# Patient Record
Sex: Male | Born: 1970 | Race: White | Hispanic: No | Marital: Married | Smoking: Current every day smoker
Health system: Southern US, Community
[De-identification: ages and names within clinical notes are randomized; demographics above are authoritative.]

## PROBLEM LIST (undated history)

## (undated) DIAGNOSIS — F32A Depression, unspecified: Secondary | ICD-10-CM

## (undated) DIAGNOSIS — E7800 Pure hypercholesterolemia, unspecified: Secondary | ICD-10-CM

## (undated) DIAGNOSIS — R7989 Other specified abnormal findings of blood chemistry: Secondary | ICD-10-CM

## (undated) DIAGNOSIS — J302 Other seasonal allergic rhinitis: Secondary | ICD-10-CM

## (undated) DIAGNOSIS — J45909 Unspecified asthma, uncomplicated: Secondary | ICD-10-CM

## (undated) DIAGNOSIS — I10 Essential (primary) hypertension: Secondary | ICD-10-CM

## (undated) DIAGNOSIS — G8929 Other chronic pain: Secondary | ICD-10-CM

## (undated) DIAGNOSIS — F988 Other specified behavioral and emotional disorders with onset usually occurring in childhood and adolescence: Secondary | ICD-10-CM

## (undated) DIAGNOSIS — F1010 Alcohol abuse, uncomplicated: Secondary | ICD-10-CM

## (undated) DIAGNOSIS — F419 Anxiety disorder, unspecified: Secondary | ICD-10-CM

## (undated) HISTORY — DX: Other specified abnormal findings of blood chemistry: R79.89

## (undated) HISTORY — PX: SHOULDER ARTHROSCOPY: SHX128

## (undated) HISTORY — DX: Other chronic pain: G89.29

## (undated) HISTORY — PX: LEG SURGERY: SHX1003

## (undated) HISTORY — DX: Other seasonal allergic rhinitis: J30.2

## (undated) HISTORY — DX: Depression, unspecified: F32.A

## (undated) HISTORY — PX: ADENOIDECTOMY: SUR15

## (undated) HISTORY — DX: Other specified behavioral and emotional disorders with onset usually occurring in childhood and adolescence: F98.8

## (undated) HISTORY — DX: Anxiety disorder, unspecified: F41.9

## (undated) HISTORY — PX: KNEE SURGERY: SHX244

---

## 2006-02-24 ENCOUNTER — Ambulatory Visit (HOSPITAL_BASED_OUTPATIENT_CLINIC_OR_DEPARTMENT_OTHER): Admission: RE | Admit: 2006-02-24 | Discharge: 2006-02-24 | Payer: Self-pay | Admitting: Orthopedic Surgery

## 2006-03-21 ENCOUNTER — Encounter: Admission: RE | Admit: 2006-03-21 | Discharge: 2006-03-21 | Payer: Self-pay | Admitting: Orthopedic Surgery

## 2009-08-21 ENCOUNTER — Ambulatory Visit: Payer: Self-pay | Admitting: Diagnostic Radiology

## 2009-08-21 ENCOUNTER — Emergency Department (HOSPITAL_BASED_OUTPATIENT_CLINIC_OR_DEPARTMENT_OTHER): Admission: EM | Admit: 2009-08-21 | Discharge: 2009-08-21 | Payer: Self-pay | Admitting: Emergency Medicine

## 2011-04-01 NOTE — Op Note (Signed)
NAME:  Marvin Cooper, Marvin Cooper                 ACCOUNT NO.:  [id number]   MEDICAL RECORD NO.:  [id number]          PATIENT TYPE:  AMB   LOCATION:  DSC                          FACILITY:  [facility]   PHYSICIAN:  Harvie Junior, M.D.   DATE OF BIRTH:  [birth year]   DATE OF PROCEDURE:  02/24/2006  DATE OF DISCHARGE:                                 OPERATIVE REPORT   PREOPERATIVE DIAGNOSIS:  Medial side knee pain with suspected scarring,  status post IM rod of the tibia.   POSTOPERATIVE DIAGNOSIS:  Medial side knee pain with suspected scarring,  status post IM rod of the tibia.   PRINCIPAL PROCEDURE:  1.  Chondroplasty medial femoral condyle.  2.  Medial and lateral synovectomy with debridement of synovial tissue with      scar from underneath the patellar tendon on the anterior aspect of the      tibia.   SURGEON:  Graves.   ASSISTANT:  B[place]une.   ANESTHESIA:  General.   BRIEF HISTORY:  [name] is a 40 year old male with a long history of  having had previous tibial rodding.  He continued to have medial side knee  pain.  He was treated with injection therapy and seemed to help.  Ultimately  felt it was probably a scar-related issue or it could be a chondral injury.  He was taken to the operating room for operative knee arthroscopy.   PROCEDURE:  The patient was taken to the operating room and after adequate  anesthesia was obtained with general anesthetic, the patient was placed upon  the operating room table.  The right knee was then prepped and draped in the  usual sterile fashion.  Following this, the arthroscopic portals were made  and a tremendous amount of scar tissue was encountered on the medial side.  This was debrided.  Once this debridement was undertaken, there was a small  chondral injury on the medial side.  The medial meniscus was normal.  The  lateral meniscus was normal.  The mild chondral injury on the medial side  was debrided by way of chondroplasty.  Attention was then  turned towards the  medial and lateral gutters which really just had a significant amount of  scar tissue.  This was debrided.  Then we went down on the anterior aspect  of the tibia, debriding that all the way down on the anterior tibia down to  bone.  This was debrided, and this freed up the patellar tendon to some  degree.  At this point, the knee was copiously irrigated and suctioned dry.  The arthroscopic portals were closed with a bandage.  A sterile compression  dressing was applied.  The patient was taken to the recovery room where he  was noted to be in satisfactory condition.  Estimated blood loss for the  procedure was none.     Harvie Junior, M.D.  Electronically Signed    JLG/MEDQ  D:  02/24/2006  T:  02/24/2006  Job:  [id number]

## 2012-06-07 ENCOUNTER — Ambulatory Visit: Payer: BC Managed Care – PPO | Admitting: Rehabilitation

## 2013-07-12 ENCOUNTER — Emergency Department (HOSPITAL_COMMUNITY)
Admission: EM | Admit: 2013-07-12 | Discharge: 2013-07-13 | Disposition: A | Discharge status: Other Acute Inpatient Hospital | Payer: BC Managed Care – PPO | Attending: Emergency Medicine | Admitting: Emergency Medicine

## 2013-07-12 ENCOUNTER — Encounter (HOSPITAL_COMMUNITY): Payer: Self-pay | Admitting: Emergency Medicine

## 2013-07-12 DIAGNOSIS — F1210 Cannabis abuse, uncomplicated: Secondary | ICD-10-CM | POA: Insufficient documentation

## 2013-07-12 DIAGNOSIS — E7800 Pure hypercholesterolemia, unspecified: Secondary | ICD-10-CM | POA: Insufficient documentation

## 2013-07-12 DIAGNOSIS — F1010 Alcohol abuse, uncomplicated: Secondary | ICD-10-CM | POA: Insufficient documentation

## 2013-07-12 DIAGNOSIS — I10 Essential (primary) hypertension: Secondary | ICD-10-CM | POA: Insufficient documentation

## 2013-07-12 DIAGNOSIS — F17200 Nicotine dependence, unspecified, uncomplicated: Secondary | ICD-10-CM | POA: Insufficient documentation

## 2013-07-12 HISTORY — DX: Pure hypercholesterolemia, unspecified: E78.00

## 2013-07-12 HISTORY — DX: Essential (primary) hypertension: I10

## 2013-07-12 LAB — COMPREHENSIVE METABOLIC PANEL
AST: 33 U/L (ref 0–37)
Albumin: 4.2 g/dL (ref 3.5–5.2)
BUN: 11 mg/dL (ref 6–23)
CO2: 29 mEq/L (ref 19–32)
Calcium: 9.2 mg/dL (ref 8.4–10.5)
Chloride: 104 mEq/L (ref 96–112)
Creatinine, Ser: 0.68 mg/dL (ref 0.50–1.35)
GFR calc Af Amer: >90 mL/min (ref >90)
GFR calc non Af Amer: >90 mL/min (ref >90)
Sodium: 143 mEq/L (ref 135–145)
Total Bilirubin: 0.2 mg/dL — ABNORMAL LOW (ref 0.3–1.2)

## 2013-07-12 LAB — RAPID URINE DRUG SCREEN, HOSP PERFORMED
Barbiturates: NOT DETECTED
Cocaine: NOT DETECTED

## 2013-07-12 LAB — SALICYLATE LEVEL: Salicylate Lvl: <2 mg/dL — ABNORMAL LOW (ref 2.8–20.0)

## 2013-07-12 LAB — CBC
MCH: 31.1 pg (ref 26.0–34.0)
MCV: 90.9 fL (ref 78.0–100.0)
Platelets: 221 10*3/uL (ref 150–400)
RBC: 5.27 MIL/uL (ref 4.22–5.81)
RDW: 15 % (ref 11.5–15.5)
WBC: 10.6 10*3/uL — ABNORMAL HIGH (ref 4.0–10.5)

## 2013-07-12 LAB — ETHANOL: Alcohol, Ethyl (B): 333 mg/dL — ABNORMAL HIGH (ref 0–11)

## 2013-07-12 MED ORDER — ONDANSETRON HCL 4 MG PO TABS: 4.0000 mg | ORAL_TABLET | Freq: Three times a day (TID) | ORAL | Status: DC | PRN

## 2013-07-12 MED ORDER — THIAMINE HCL 100 MG/ML IJ SOLN: 100.0000 mg | Freq: Every day | INTRAMUSCULAR | Status: DC

## 2013-07-12 MED ORDER — LORAZEPAM 2 MG/ML IJ SOLN: 1.0000 mg | Freq: Four times a day (QID) | INTRAMUSCULAR | Status: DC | PRN

## 2013-07-12 MED ORDER — IBUPROFEN 200 MG PO TABS: 600.0000 mg | ORAL_TABLET | Freq: Three times a day (TID) | ORAL | Status: DC | PRN

## 2013-07-12 MED ORDER — VITAMIN B-1 100 MG PO TABS
100.0000 mg | ORAL_TABLET | Freq: Every day | ORAL | Status: DC
  Administered 2013-07-12 – 2013-07-13 (×2): 100 mg via ORAL
  Filled 2013-07-12 (×2): qty 1

## 2013-07-12 MED ORDER — ALUM & MAG HYDROXIDE-SIMETH 200-200-20 MG/5ML PO SUSP: 30.0000 mL | ORAL | Status: DC | PRN

## 2013-07-12 MED ORDER — LORAZEPAM 1 MG PO TABS
0.0000 mg | ORAL_TABLET | Freq: Four times a day (QID) | ORAL | Status: DC
  Administered 2013-07-12 – 2013-07-13 (×3): 1 mg via ORAL
  Filled 2013-07-12 (×3): qty 1

## 2013-07-12 MED ORDER — LORAZEPAM 1 MG PO TABS
1.0000 mg | ORAL_TABLET | Freq: Four times a day (QID) | ORAL | Status: DC | PRN
  Administered 2013-07-12: 1 mg via ORAL
  Filled 2013-07-12: qty 1

## 2013-07-12 MED ORDER — LORAZEPAM 1 MG PO TABS
1.0000 mg | ORAL_TABLET | Freq: Three times a day (TID) | ORAL | Status: DC | PRN
Start: 2013-07-12 — End: 2013-07-13

## 2013-07-12 MED ORDER — LORAZEPAM 1 MG PO TABS: 0.0000 mg | ORAL_TABLET | Freq: Two times a day (BID) | ORAL | Status: DC

## 2013-07-12 MED ORDER — FOLIC ACID 1 MG PO TABS
1.0000 mg | ORAL_TABLET | Freq: Every day | ORAL | Status: DC
  Administered 2013-07-12 – 2013-07-13 (×2): 1 mg via ORAL
  Filled 2013-07-12 (×2): qty 1

## 2013-07-12 MED ORDER — NICOTINE 21 MG/24HR TD PT24
21.0000 mg | MEDICATED_PATCH | Freq: Every day | TRANSDERMAL | Status: DC
  Administered 2013-07-12 – 2013-07-13 (×2): 21 mg via TRANSDERMAL
  Filled 2013-07-12 (×2): qty 1

## 2013-07-12 MED ORDER — ZOLPIDEM TARTRATE 5 MG PO TABS
5.0000 mg | ORAL_TABLET | Freq: Every evening | ORAL | Status: DC | PRN
  Administered 2013-07-12: 5 mg via ORAL
  Filled 2013-07-12: qty 1

## 2013-07-12 MED ORDER — ADULT MULTIVITAMIN W/MINERALS CH
1.0000 | ORAL_TABLET | Freq: Every day | ORAL | Status: DC
  Administered 2013-07-12 – 2013-07-13 (×2): 1 via ORAL
  Filled 2013-07-12 (×2): qty 1

## 2013-07-12 MED ORDER — ALBUTEROL SULFATE HFA 108 (90 BASE) MCG/ACT IN AERS
2.0000 | INHALATION_SPRAY | RESPIRATORY_TRACT | Status: DC | PRN
  Administered 2013-07-12 – 2013-07-13 (×2): 2 via RESPIRATORY_TRACT
  Filled 2013-07-12 (×2): qty 6.7

## 2013-07-12 NOTE — ED Notes (Addendum)
One bag of pt belongings transferred to Psych ED and given to Psych ED staff.

## 2013-07-12 NOTE — ED Notes (Signed)
Pt was up at nursing station calling to check on his son and stated he really needed his Albuterol Inhaler because he's wheezing. He does use a nebulizer at home but doesn't feel he needs that at this time. Writer called EDP Pollina and requested Albuterol inhaler and CIWA protocol to be ordered; he said he'll order both. Pt currently talking to his wife.

## 2013-07-12 NOTE — ED Notes (Signed)
Pt changing into scrubs and attempting to urinate at this time

## 2013-07-12 NOTE — ED Provider Notes (Signed)
Medical screening examination/treatment/procedure(s) were performed by non-physician practitioner and as supervising physician I was immediately available for consultation/collaboration.   William Rhyse Skowron, MD 07/12/13 2057 
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
## 2013-07-12 NOTE — ED Notes (Signed)
Pt signed consent written so that his son, mom and wife may get updates on him, it's in his chart.

## 2013-07-12 NOTE — BH Assessment (Signed)
[facility] Assessment Progress Note Update:  Pt accepted by Maryjean Morn, PA, @ [phone] pending a bed at [facility].  There are no beds currently.  Therefore, TTS will need to attempt placement at [facility] and [facility].

## 2013-07-12 NOTE — ED Provider Notes (Signed)
CSN: [id number]     Arrival date & time 07/12/13  1504 History  This chart was scribed for non-physician practitioner, Johnnette Gourd, PA-C, working with Dagmar Hait, MD by Ronal Fear, ED scribe. This patient was seen in room [facility]TR4/[facility]PT4 and the patient's care was started at 3:11 PM.      Chief Complaint  Patient presents with  . Medical Clearance    The history is provided by the patient. No language interpreter was used.    HPI Comments: Marvin Cooper is a 42 y.o. male who presents to the Emergency Department presenting with alcohol intoxication asking for detox. Pt states that he has had the drinking problem for years. He states he started drinking because his son is in the hospital. He states that he "drinks as much liquor as he can".  His last drink was this morning. Pt took 2 clonopin and smoked marijuana today. Pt states that he feels sad that he did this to his family. He takes HTN and high cholesterol medications daily. Pt denies SI/HI. Pt denies any other recreational drug use. Denies any other complaints at this time.   No past medical history on file. No past surgical history on file. No family history on file. History  Substance Use Topics  . Smoking status: Not on file  . Smokeless tobacco: Not on file  . Alcohol Use: Not on file    Review of Systems  Psychiatric/Behavioral: Negative for suicidal ideas.  All other systems reviewed and are negative.    Allergies  Review of patient's allergies indicates not on file.  Home Medications  No current outpatient prescriptions on file.  Triage Vitals: BP 146/97  Pulse 89  Temp(Src) 98.7 F (37.1 C) (Oral)  Resp 16  SpO2 100%  Physical Exam  Nursing note and vitals reviewed. Constitutional: He is oriented to person, place, and time. He appears well-developed and well-nourished. No distress.  Smells of alcohol  HENT:  Head: Normocephalic and atraumatic.  Eyes: Conjunctivae and EOM are normal. Pupils are  equal, round, and reactive to light.  Neck: Normal range of motion. Neck supple.  Cardiovascular: Normal rate, regular rhythm and normal heart sounds.   Pulmonary/Chest: Effort normal and breath sounds normal. No respiratory distress. He has no wheezes. He has no rales.  Abdominal: Soft. There is no tenderness.  Musculoskeletal: Normal range of motion. He exhibits no edema.  Neurological: He is alert and oriented to person, place, and time.  Skin: Skin is warm and dry.  Psychiatric: He has a normal mood and affect. His behavior is normal.    ED Course  Procedures (including critical care time)  DIAGNOSTIC STUDIES: Oxygen Saturation is 100% on RA, normal by my interpretation.    COORDINATION OF CARE: 3:35 PM- Pt advised of plan for treatment including detiox and pt agrees.    Labs Review Labs Reviewed  ACETAMINOPHEN LEVEL  CBC  COMPREHENSIVE METABOLIC PANEL  ETHANOL  SALICYLATE LEVEL  URINE RAPID DRUG SCREEN (HOSP PERFORMED)   Imaging Review No results found.  MDM   1. Alcohol abuse    Patient presenting with alcohol abuse, requesting detox and rehabilitation. No suicidal or homicidal ideations. Psych labs pending. Awaiting act team consult. He is in no apparent distress. Physical exam unremarkable other than odor of alcohol.   Spoke with Ava with ACT team who will evaluate patient.  Patient medically cleared.   I personally performed the services described in this documentation, which was scribed in my presence. The  recorded information has been reviewed and is accurate.    Trevor Mace, PA-C 07/12/13 1955

## 2013-07-12 NOTE — BH Assessment (Signed)
Staff from [facility] called back. They have exhausted sponsorship for [facility] residents and will not be able to accept the Pt at this time. TTS staff will contact [facility] for placement. Pt has been accepted to [facility] and is pending a bed.  Harlin Rain Ria Comment, [facility] Triage Specialist
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
## 2013-07-12 NOTE — BH Assessment (Signed)
Contacted Christy at [facility] who said a bed is available. Faxed information to [facility] at [phone].  Harlin Rain Ria Comment, [facility] Triage Specialist

## 2013-07-12 NOTE — ED Notes (Addendum)
Per EMS: Wife came home and found pt unresponsive in floor. Wife called EMS, upon EMS arrival pt was A/O and ambulatory. Pt reports drinking a 12 pack of beer, a pint of liquor today, taking several klonopin, and smoking marijuana today. Pt has a history of ETOH abuse. Pt denies HI/SI.

## 2013-07-12 NOTE — ED Notes (Signed)
Marvin Cooper, wife- wants to be called if pt is moved to different facility [phone]

## 2013-07-13 ENCOUNTER — Encounter (HOSPITAL_COMMUNITY): Payer: Self-pay | Admitting: *Deleted

## 2013-07-13 ENCOUNTER — Inpatient Hospital Stay (HOSPITAL_COMMUNITY)
Admission: AD | Admit: 2013-07-13 | Discharge: 2013-07-15 | DRG: 897 | Disposition: A | Discharge status: Home / Self Care | Payer: No Typology Code available for payment source | Source: Intra-hospital | Attending: Psychiatry | Admitting: Psychiatry

## 2013-07-13 DIAGNOSIS — E7800 Pure hypercholesterolemia, unspecified: Secondary | ICD-10-CM | POA: Diagnosis present

## 2013-07-13 DIAGNOSIS — F1910 Other psychoactive substance abuse, uncomplicated: Secondary | ICD-10-CM

## 2013-07-13 DIAGNOSIS — F1010 Alcohol abuse, uncomplicated: Secondary | ICD-10-CM | POA: Diagnosis present

## 2013-07-13 DIAGNOSIS — F1020 Alcohol dependence, uncomplicated: Secondary | ICD-10-CM | POA: Diagnosis present

## 2013-07-13 DIAGNOSIS — F1410 Cocaine abuse, uncomplicated: Secondary | ICD-10-CM | POA: Diagnosis present

## 2013-07-13 DIAGNOSIS — F10939 Alcohol use, unspecified with withdrawal, unspecified: Principal | ICD-10-CM | POA: Diagnosis present

## 2013-07-13 DIAGNOSIS — F10239 Alcohol dependence with withdrawal, unspecified: Principal | ICD-10-CM | POA: Diagnosis present

## 2013-07-13 DIAGNOSIS — F17200 Nicotine dependence, unspecified, uncomplicated: Secondary | ICD-10-CM | POA: Diagnosis present

## 2013-07-13 DIAGNOSIS — I10 Essential (primary) hypertension: Secondary | ICD-10-CM | POA: Diagnosis present

## 2013-07-13 DIAGNOSIS — F329 Major depressive disorder, single episode, unspecified: Secondary | ICD-10-CM | POA: Diagnosis present

## 2013-07-13 DIAGNOSIS — F331 Major depressive disorder, recurrent, moderate: Secondary | ICD-10-CM | POA: Diagnosis present

## 2013-07-13 DIAGNOSIS — F411 Generalized anxiety disorder: Secondary | ICD-10-CM | POA: Diagnosis present

## 2013-07-13 MED ORDER — NIACIN ER 500 MG PO CPCR
500.0000 mg | ORAL_CAPSULE | Freq: Every day | ORAL | Status: DC
  Administered 2013-07-13: 500 mg via ORAL
  Filled 2013-07-13 (×2): qty 1

## 2013-07-13 MED ORDER — CITALOPRAM HYDROBROMIDE 20 MG PO TABS
20.0000 mg | ORAL_TABLET | Freq: Every day | ORAL | Status: DC
  Administered 2013-07-14 – 2013-07-15 (×2): 20 mg via ORAL
  Filled 2013-07-13: qty 1
  Filled 2013-07-13: qty 14
  Filled 2013-07-13 (×2): qty 1

## 2013-07-13 MED ORDER — ONDANSETRON HCL 4 MG PO TABS: 4.0000 mg | ORAL_TABLET | Freq: Three times a day (TID) | ORAL | Status: DC | PRN

## 2013-07-13 MED ORDER — CITALOPRAM HYDROBROMIDE 40 MG PO TABS: 40.0000 mg | ORAL_TABLET | Freq: Every day | ORAL | Status: DC

## 2013-07-13 MED ORDER — OMEGA-3-ACID ETHYL ESTERS 1 G PO CAPS
1.0000 g | ORAL_CAPSULE | Freq: Two times a day (BID) | ORAL | Status: DC
  Administered 2013-07-13 – 2013-07-15 (×4): 1 g via ORAL
  Filled 2013-07-13 (×4): qty 1
  Filled 2013-07-13: qty 28
  Filled 2013-07-13 (×2): qty 1
  Filled 2013-07-13: qty 28
  Filled 2013-07-13: qty 1

## 2013-07-13 MED ORDER — ACETAMINOPHEN 325 MG PO TABS: 650.0000 mg | ORAL_TABLET | Freq: Four times a day (QID) | ORAL | Status: DC | PRN

## 2013-07-13 MED ORDER — FENOFIBRATE 160 MG PO TABS
160.0000 mg | ORAL_TABLET | Freq: Every day | ORAL | Status: DC
  Administered 2013-07-13 – 2013-07-15 (×3): 160 mg via ORAL
  Filled 2013-07-13 (×2): qty 1
  Filled 2013-07-13: qty 14
  Filled 2013-07-13 (×2): qty 1

## 2013-07-13 MED ORDER — ADULT MULTIVITAMIN W/MINERALS CH
1.0000 | ORAL_TABLET | Freq: Every day | ORAL | Status: DC
  Administered 2013-07-13 – 2013-07-15 (×3): 1 via ORAL
  Filled 2013-07-13 (×6): qty 1

## 2013-07-13 MED ORDER — TRAZODONE HCL 100 MG PO TABS
50.0000 mg | ORAL_TABLET | Freq: Every evening | ORAL | Status: DC | PRN
  Administered 2013-07-13 – 2013-07-14 (×2): 50 mg via ORAL
  Filled 2013-07-13 (×2): qty 1
  Filled 2013-07-13 (×2): qty 14

## 2013-07-13 MED ORDER — NICOTINE 21 MG/24HR TD PT24
21.0000 mg | MEDICATED_PATCH | Freq: Every day | TRANSDERMAL | Status: DC
  Administered 2013-07-13 – 2013-07-15 (×3): 21 mg via TRANSDERMAL
  Filled 2013-07-13 (×7): qty 1

## 2013-07-13 MED ORDER — NIACIN ER 500 MG PO CPCR
500.0000 mg | ORAL_CAPSULE | Freq: Every day | ORAL | Status: DC
  Administered 2013-07-14 (×2): 500 mg via ORAL
  Filled 2013-07-13 (×4): qty 1
  Filled 2013-07-13: qty 14

## 2013-07-13 MED ORDER — LORAZEPAM 1 MG PO TABS: 0.0000 mg | ORAL_TABLET | Freq: Two times a day (BID) | ORAL | Status: DC

## 2013-07-13 MED ORDER — BENAZEPRIL HCL 10 MG PO TABS
40.0000 mg | ORAL_TABLET | Freq: Every day | ORAL | Status: DC
  Administered 2013-07-14 – 2013-07-15 (×2): 40 mg via ORAL
  Filled 2013-07-13 (×2): qty 1
  Filled 2013-07-13: qty 56
  Filled 2013-07-13: qty 1

## 2013-07-13 MED ORDER — LORATADINE 10 MG PO TABS
10.0000 mg | ORAL_TABLET | Freq: Every day | ORAL | Status: DC
  Administered 2013-07-13: 10 mg via ORAL
  Filled 2013-07-13 (×2): qty 1

## 2013-07-13 MED ORDER — OMEGA-3-ACID ETHYL ESTERS 1 G PO CAPS
2.0000 g | ORAL_CAPSULE | Freq: Two times a day (BID) | ORAL | Status: DC
  Administered 2013-07-13 (×2): 2 g via ORAL
  Filled 2013-07-13 (×4): qty 2

## 2013-07-13 MED ORDER — CITALOPRAM HYDROBROMIDE 20 MG PO TABS
20.0000 mg | ORAL_TABLET | Freq: Every day | ORAL | Status: DC
  Administered 2013-07-13: 20 mg via ORAL
  Filled 2013-07-13 (×2): qty 1

## 2013-07-13 MED ORDER — VITAMIN B-1 100 MG PO TABS
100.0000 mg | ORAL_TABLET | Freq: Every day | ORAL | Status: DC
  Administered 2013-07-13 – 2013-07-15 (×3): 100 mg via ORAL
  Filled 2013-07-13 (×5): qty 1

## 2013-07-13 MED ORDER — LORAZEPAM 1 MG PO TABS
0.0000 mg | ORAL_TABLET | Freq: Four times a day (QID) | ORAL | Status: DC
  Administered 2013-07-13 – 2013-07-15 (×6): 1 mg via ORAL
  Filled 2013-07-13 (×6): qty 1

## 2013-07-13 MED ORDER — ALBUTEROL SULFATE HFA 108 (90 BASE) MCG/ACT IN AERS
2.0000 | INHALATION_SPRAY | RESPIRATORY_TRACT | Status: DC | PRN
  Administered 2013-07-15 (×2): 2 via RESPIRATORY_TRACT

## 2013-07-13 MED ORDER — SIMVASTATIN 40 MG PO TABS
40.0000 mg | ORAL_TABLET | Freq: Every day | ORAL | Status: DC
  Administered 2013-07-13: 40 mg via ORAL
  Filled 2013-07-13 (×2): qty 1

## 2013-07-13 MED ORDER — SIMVASTATIN 40 MG PO TABS
40.0000 mg | ORAL_TABLET | Freq: Every day | ORAL | Status: DC
  Administered 2013-07-14 (×2): 40 mg via ORAL
  Filled 2013-07-13: qty 1
  Filled 2013-07-13: qty 14
  Filled 2013-07-13 (×3): qty 1

## 2013-07-13 MED ORDER — BENAZEPRIL HCL 40 MG PO TABS
40.0000 mg | ORAL_TABLET | Freq: Every day | ORAL | Status: DC
  Administered 2013-07-13: 40 mg via ORAL
  Filled 2013-07-13 (×2): qty 1

## 2013-07-13 MED ORDER — THIAMINE HCL 100 MG/ML IJ SOLN: 100.0000 mg | Freq: Every day | INTRAMUSCULAR | Status: DC

## 2013-07-13 MED ORDER — IBUPROFEN 600 MG PO TABS: 600.0000 mg | ORAL_TABLET | Freq: Three times a day (TID) | ORAL | Status: DC | PRN

## 2013-07-13 MED ORDER — ALUM & MAG HYDROXIDE-SIMETH 200-200-20 MG/5ML PO SUSP: 30.0000 mL | ORAL | Status: DC | PRN

## 2013-07-13 MED ORDER — FOLIC ACID 1 MG PO TABS
1.0000 mg | ORAL_TABLET | Freq: Every day | ORAL | Status: DC
Start: 2013-07-13 — End: 2013-07-15
  Administered 2013-07-13 – 2013-07-15 (×3): 1 mg via ORAL
  Filled 2013-07-13 (×5): qty 1

## 2013-07-13 MED ORDER — LORATADINE 10 MG PO TABS
10.0000 mg | ORAL_TABLET | Freq: Every day | ORAL | Status: DC
  Administered 2013-07-14 – 2013-07-15 (×2): 10 mg via ORAL
  Filled 2013-07-13 (×4): qty 1

## 2013-07-13 MED ORDER — MAGNESIUM HYDROXIDE 400 MG/5ML PO SUSP: 30.0000 mL | Freq: Every day | ORAL | Status: DC | PRN

## 2013-07-13 MED ORDER — FENOFIBRATE 54 MG PO TABS
54.0000 mg | ORAL_TABLET | Freq: Every day | ORAL | Status: DC
  Administered 2013-07-13: 54 mg via ORAL
  Filled 2013-07-13 (×2): qty 1

## 2013-07-13 NOTE — [facility] Counselor (Signed)
Pt accepted to [facility] to room 300-2.  Pt ready to be received to [facility] at 1530.  If ED can't make that time frame then pt can be received by 1715.    Call report prior to sending pt - [phone]  Security can transport  Voluntary form sent to ED for nurse to complete with pt.  Send original with pt to [facility] and fax copy to [facility] prior to sending pt.  [phone] fax

## 2013-07-13 NOTE — Consult Note (Signed)
  Patient was accepted by another provider for inpatient admission.  He was seen today by this Clinical research associate and Dr Orpha Bur  Who agrees with the plan of care for an inpatient admission.  Patient will be admitted once bed is available, meanwhile, we will continue detox with our Ativan Detox protocol.  This an patient denies drinking excessively to hurt himself.  He says he bing drank and used Klonopin because he wanted to "numb" his pain.  Patient states his son was involved in an accident and will be be losing one of his legs.  Patient also reports a diagnosis of depression and that he has been on Celexa 40 mg daily.  Patient reports started seeing a counselor in the past three weeks before he came in to the ER. Plan: We will admit him to inpatient unit as soon as bed is available We will resume his Celexa if not already done. Marvin Cooper   PMHNP-BC.
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
## 2013-07-13 NOTE — ED Notes (Signed)
Belongings given to his wife to take home. Transferred to [facility] via hospital security and mental health tech. Ambulatory without difficulty. No complaints voiced.

## 2013-07-13 NOTE — BH Assessment (Signed)
Contacted [employer] to obtain sponsorship authorization for [facility]. Cheryl at [place] [facility] said that she was unable to enroll Pt and that [facility] staff would need to enroll Pt through [facility]. There is no weekend staff that has access to the [facility] system and therefore Pt cannot be transferred to [facility] this weekend. To avoid delay in Pt care Pt will be admitted to [facility] when a bed is available.  Harlin Rain Ria Comment, [facility] Triage Specialist

## 2013-07-14 ENCOUNTER — Encounter (HOSPITAL_COMMUNITY): Payer: Self-pay | Admitting: Physician Assistant

## 2013-07-14 DIAGNOSIS — F331 Major depressive disorder, recurrent, moderate: Secondary | ICD-10-CM

## 2013-07-14 DIAGNOSIS — F411 Generalized anxiety disorder: Secondary | ICD-10-CM

## 2013-07-14 DIAGNOSIS — F1010 Alcohol abuse, uncomplicated: Secondary | ICD-10-CM
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
## 2013-07-14 NOTE — [facility] Group Notes (Signed)
[facility] Group Notes:  (Nursing/MHT/Case Management/Adjunct)  Date:  07/14/2013  Time:  10:33 AM  Type of Therapy:  Psychoeducational Skills  Participation Level:  Did Not Attend   Buford Dresser 07/14/2013, 10:33 AM

## 2013-07-14 NOTE — Progress Notes (Signed)
Patient ID: Marvin Cooper, male   DOB: 04/13/1971, 42 y.o.   MRN: [id number] D)  Has been pleasant but quiet, doesn't seem to initiate conversation but is conversational when spoken to, appropriate.  States feeling anxious has been what he has been feeling most, and a little irritable, but the ativan is helping.  Attended group this evening, had a snack, orders obtained to restart home meds.   A)  Will continue to monitor for safety, continue POC, protocol R)  Safety maintained.

## 2013-07-14 NOTE — [facility] Group Notes (Signed)
[facility] Group Notes:  (Clinical Social Work)  07/14/2013  10:00-11:00AM  Summary of Progress/Problems:   The main focus of today's process group was to   identify the patient's current support system and decide on other supports that can be put in place.  The picture on workbook was used to discuss why additional supports are needed, and a hand-out was distributed with four definitions/levels of support, then used to talk about how patients have given and received all different kinds of support.  An emphasis was placed on using counselor, doctor, therapy groups, 12-step groups, and problem-specific support groups to expand supports.  The patient was only present for group for about the last 15 minutes, appeared depressed and had poor eye contact.  He did not volunteer any comments or personal information, and CSW chose not to call on him due to his slumped posture, his depressed mood, and his new arrival.  Others were processing well, and he did appear to listen intently.  CSW mentioned that it is very uncomfortable when one first comes to group, and that these participants had really opened up, with intention of encouraging him that it will become more comfortable for him later.  Type of Therapy:  Process Group with Motivational Interviewing  Participation Level:  Minimal  Participation Quality:  Attentive  Affect:  Depressed and Flat  Cognitive:  Oriented  Insight:  Limited  Engagement in Therapy:  Limited  Modes of Intervention:   Education, Support and Processing, Activity  Ambrose Mantle, LCSW 07/14/2013, 12:55 PM
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
## 2013-07-14 NOTE — Progress Notes (Signed)
Patient ID: Marvin Cooper, male   DOB: 22-Jan-1971, 42 y.o.   MRN: [id number] D: patient presents with flat, blunted affect.  He continues to have withdrawal symptoms from alcohol withdrawal.  Patient states that he was using alcohol to "numb" his feelings as he has been going through a difficult time.  He is motivated for treatment.  He denies any SI/HI/AVH.  Patient's CIWA is a 4 and 1 mg ativan was administered.  A: continue to monitor MD orders and medication management.  Safety checks completed every 15 minutes per protocol.  R: Patient's behavior has been appropriate.

## 2013-07-14 NOTE — H&P (Signed)
  Pt was seen by me today and I agree with the key elements documented in H&P.  

## 2013-07-14 NOTE — [facility] Suicide Risk Assessment (Signed)
Suicide Risk Assessment  Admission Assessment     Nursing information obtained from:    Demographic factors:   male Current Mental Status:   no si, no hi, no avh, mood is sad Loss Factors:   son lost leg recently Historical Factors:   depresed Risk Reduction Factors:   lives with family, working  CLINICAL FACTORS:   Alcohol/Substance Abuse/Dependencies  COGNITIVE FEATURES THAT CONTRIBUTE TO RISK:  Closed-mindedness    SUICIDE RISK:   Mild:  Suicidal ideation of limited frequency, intensity, duration, and specificity.  There are no identifiable plans, no associated intent, mild dysphoria and related symptoms, good self-control (both objective and subjective assessment), few other risk factors, and identifiable protective factors, including available and accessible social support.  PLAN OF CARE:  Continue current meds  I certify that inpatient services furnished can reasonably be expected to improve the patient's condition.  Wonda Cerise 07/14/2013, 12:04 PM
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
## 2013-07-15 DIAGNOSIS — F329 Major depressive disorder, single episode, unspecified: Secondary | ICD-10-CM | POA: Diagnosis present

## 2013-07-15 DIAGNOSIS — F1020 Alcohol dependence, uncomplicated: Secondary | ICD-10-CM

## 2013-07-15 DIAGNOSIS — F1010 Alcohol abuse, uncomplicated: Secondary | ICD-10-CM | POA: Diagnosis present

## 2013-07-15 DIAGNOSIS — F10239 Alcohol dependence with withdrawal, unspecified: Principal | ICD-10-CM

## 2013-07-15 MED ORDER — TRAZODONE HCL 50 MG PO TABS: 50.0000 mg | ORAL_TABLET | Freq: Every evening | ORAL | Status: DC | PRN

## 2013-07-15 MED ORDER — ALBUTEROL SULFATE HFA 108 (90 BASE) MCG/ACT IN AERS: 2.0000 | INHALATION_SPRAY | RESPIRATORY_TRACT | Status: DC | PRN

## 2013-07-15 MED ORDER — BENAZEPRIL HCL 40 MG PO TABS: 40.0000 mg | ORAL_TABLET | Freq: Every day | ORAL | Status: DC

## 2013-07-15 MED ORDER — CETIRIZINE HCL 10 MG PO TABS: 10.0000 mg | ORAL_TABLET | Freq: Every day | ORAL | Status: DC

## 2013-07-15 MED ORDER — FENOFIBRATE 150 MG PO CAPS: 150.0000 | ORAL_CAPSULE | Freq: Every day | ORAL | Status: DC

## 2013-07-15 MED ORDER — OMEGA-3-ACID ETHYL ESTERS 1 G PO CAPS: 2.0000 g | ORAL_CAPSULE | Freq: Two times a day (BID) | ORAL | Status: DC

## 2013-07-15 MED ORDER — NIACIN ER 500 MG PO CPCR: 500.0000 mg | ORAL_CAPSULE | Freq: Every day | ORAL | Status: DC

## 2013-07-15 MED ORDER — SIMVASTATIN 40 MG PO TABS: 40.0000 mg | ORAL_TABLET | Freq: Every day | ORAL | Status: DC

## 2013-07-15 MED ORDER — CITALOPRAM HYDROBROMIDE 20 MG PO TABS: 20.0000 mg | ORAL_TABLET | Freq: Every day | ORAL | Status: DC

## 2013-07-15 NOTE — Progress Notes (Signed)
Pt resting in bed with eyes closed. Breathing even and unlabored. Q15 min checks maintained for safety. Pt remains safe on the unit.   
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
## 2013-07-15 NOTE — Progress Notes (Signed)
[facility] Adult Case Management Discharge Plan :  Will you be returning to the same living situation after discharge: Yes,  home with wife At discharge, do you have transportation home?:Yes,  wife Do you have the ability to pay for your medications:Yes,  mental health  Release of information consent forms completed and in the chart;  Patient's signature needed at discharge.  Patient to Follow up at: Follow-up Information   Follow up with [facility]. (Office closed today. Please call office Tuesday to schedule followup visit for therapy. )    Contact information:   [address] [place], [place] [address] Phone: [phone] Fax: [phone]      Follow up with [facility]. (Office closed today. Call office tomorrow morning to schedule hospital followup appt. )    Contact information:   [address] [place], [place] [address] Phone: [phone] Fax: [phone]      Patient denies SI/HI:   Yes,  during admission and stay at [facility]. self report    Safety Planning and Suicide Prevention discussed:  Yes,  SPE not required for this pt as he did not endorse SI during admission or during stay at [facility]. SPI pamphlet provided to pt and he was encouraged to share with support system.   Smart, Gregory Barrick 07/15/2013, 1:24 PM

## 2013-07-15 NOTE — Progress Notes (Signed)
Marvin Cooper was discharged home with his wife.  Discharge instructions, follow up information, prescriptions, and sample medications given to patient who verbalized understanding.

## 2013-07-15 NOTE — Progress Notes (Signed)
D:  Marvin Cooper reports that he slept well and that his energy level is normal.  He is rating depression at 1/10.  He states that he is not currently experiencing any withdrawal symptoms other than some slight anxiety which he attributes to being here.  He did not want to take Ativan this am because he felt good.  He denies SI/HI/AVH at this time.   A:  Safety checks q 15 minutes.  Emotional support provided.  Medications administered as ordered. R:  Safety maintained on unit.

## 2013-07-15 NOTE — [facility] Suicide Risk Assessment (Signed)
[facility] INPATIENT: Family/Significant Other Suicide Prevention Education  Suicide Prevention Education:  Education Completed; No one has been identified by the patient as the family member/significant other with whom the patient will be residing, and identified as the person(s) who will aid the patient in the event of a mental health crisis (suicidal ideations/suicide attempt).   Pt did not c/o SI at admission, nor have they endorsed SI during their stay here. SPE not required. SPI pamphlet provided to pt and he was encouraged to ask questions and talk about any concerns. Pt also encouraged to share information with support system.   [employer], LCSWA 07/15/2013 1:04 PM
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
## 2013-07-18 NOTE — Progress Notes (Addendum)
Patient Discharge Instructions:  After Visit Summary (AVS):   Faxed to:  06/17/13 Discharge Summary Note:   Faxed to:  06/17/13 Psychiatric Admission Assessment Note:   Faxed to:  06/17/13 Suicide Risk Assessment - Discharge Assessment:   Faxed to:  06/17/13 Faxed/Sent to the Next Level Care provider:  06/17/13 Faxed to [facility] @ [phone] Faxed to [facility] @ [phone]  Jerelene Redden, 07/18/2013, 3:57 PM

## 2014-02-11 ENCOUNTER — Encounter (HOSPITAL_COMMUNITY): Payer: Self-pay | Admitting: Emergency Medicine

## 2014-02-11 ENCOUNTER — Emergency Department (HOSPITAL_COMMUNITY)
Admission: EM | Admit: 2014-02-11 | Discharge: 2014-02-12 | Disposition: A | Discharge status: Home / Self Care | Payer: BC Managed Care – PPO | Attending: Emergency Medicine | Admitting: Emergency Medicine

## 2014-02-11 DIAGNOSIS — F17200 Nicotine dependence, unspecified, uncomplicated: Secondary | ICD-10-CM | POA: Insufficient documentation

## 2014-02-11 DIAGNOSIS — E7800 Pure hypercholesterolemia, unspecified: Secondary | ICD-10-CM | POA: Insufficient documentation

## 2014-02-11 DIAGNOSIS — Z79899 Other long term (current) drug therapy: Secondary | ICD-10-CM | POA: Insufficient documentation

## 2014-02-11 DIAGNOSIS — F10929 Alcohol use, unspecified with intoxication, unspecified: Secondary | ICD-10-CM

## 2014-02-11 DIAGNOSIS — F1010 Alcohol abuse, uncomplicated: Secondary | ICD-10-CM | POA: Insufficient documentation

## 2014-02-11 DIAGNOSIS — I10 Essential (primary) hypertension: Secondary | ICD-10-CM | POA: Insufficient documentation

## 2014-02-11 NOTE — ED Notes (Signed)
Wife called because pt was so drunk. Unsteady gait. States he drank a bottle of vodka which he does every couple days. Wife took percocet because when he gets a Microbiologistpercocet rx he takes them all in 2-3 days. Denies SI/HI.

## 2014-02-12 MED ORDER — SODIUM CHLORIDE 0.9 % IV BOLUS (SEPSIS)
1000.0000 mL | Freq: Once | INTRAVENOUS | Status: AC
  Administered 2014-02-12: 1000 mL via INTRAVENOUS

## 2014-02-12 NOTE — ED Provider Notes (Signed)
CSN: [id number]     Arrival date & time 02/11/14  2152 History   First MD Initiated Contact with Patient 02/11/14 2302     Chief Complaint  Patient presents with  . Alcohol Intoxication     (Consider location/radiation/quality/duration/timing/severity/associated sxs/prior Treatment) HPI  This is a 43 year old male with a history of alcohol abuse, hypertension, and hyperlipidemia who presents with alcohol intoxication. Patient was brought after his wife called stating the patient was drunk. He was noted to have an unsteady gait.  Patient reports daily drinking. He states he drank a bottle of vodka today. He denies any physical complaints including chest pain, shortness of breath, abdominal pain, urinary symptoms, weakness, numbness, headache. He denies any other substance abuse.  Past Medical History  Diagnosis Date  . Hypertension   . Hypercholesteremia    Past Surgical History  Procedure Laterality Date  . Knee surgery    . Leg surgery Left Age 10    ORIF lower leg  . Adenoidectomy  Age 50   Family History  Problem Relation Age of Onset  . Alcohol abuse Brother   . Alcohol abuse Sister   . Alcohol abuse Maternal Grandfather    History  Substance Use Topics  . Smoking status: Current Every Day Smoker -- 1.00 packs/day for 20 years    Types: Cigarettes  . Smokeless tobacco: Never Used  . Alcohol Use: 48.0 oz/week    80 Cans of beer per week    Review of Systems  Constitutional: Negative.  Negative for fever.  Respiratory: Negative.  Negative for chest tightness and shortness of breath.   Cardiovascular: Negative.  Negative for chest pain.  Gastrointestinal: Negative.  Negative for nausea, vomiting and abdominal pain.  Genitourinary: Negative.  Negative for dysuria.  Skin: Negative for wound.  Neurological: Negative for headaches.  Psychiatric/Behavioral:       Intoxication  All other systems reviewed and are negative.      Allergies  Review of patient's  allergies indicates no known allergies.  Home Medications   Current Outpatient Rx  Name  Route  Sig  Dispense  Refill  . benazepril (LOTENSIN) 40 MG tablet   Oral   Take 1 tablet (40 mg total) by mouth daily. For high blood pressure control         . citalopram (CELEXA) 20 MG tablet   Oral   Take 1 tablet (20 mg total) by mouth daily. For depression   30 tablet   0   . Fenofibrate 150 MG CAPS   Oral   Take 150 mg by mouth daily. For cholesterol control         . hydrOXYzine (VISTARIL) 25 MG capsule   Oral   Take 25 mg by mouth 4 (four) times daily.         . niacin 500 MG CR capsule   Oral   Take 1,000 mg by mouth at bedtime. For low niacin         . simvastatin (ZOCOR) 40 MG tablet   Oral   Take 1 tablet (40 mg total) by mouth at bedtime. For high cholesterol control   30 tablet      . traZODone (DESYREL) 50 MG tablet   Oral   Take 1 tablet (50 mg total) by mouth at bedtime as needed and may repeat dose one time if needed for sleep. For sleep   60 tablet   0   . albuterol (PROVENTIL HFA;VENTOLIN HFA) 108 (90 BASE) MCG/ACT inhaler  Inhalation   Inhale 2 puffs into the lungs every 4 (four) hours as needed for wheezing or shortness of breath.         . cetirizine (ZYRTEC) 10 MG tablet   Oral   Take 1 tablet (10 mg total) by mouth daily. For allergies         . omega-3 acid ethyl esters (LOVAZA) 1 G capsule   Oral   Take 2 capsules (2 g total) by mouth 2 (two) times daily. For cholesterol control          BP 145/97  Pulse 101  Temp(Src) 98.1 F (36.7 C) (Oral)  Resp 18  SpO2 93% Physical Exam  Nursing note and vitals reviewed. Constitutional: He is oriented to person, place, and time.  Smells of alcohol, disheveled  HENT:  Head: Normocephalic and atraumatic.  MM dry  Eyes: Pupils are equal, round, and reactive to light.  4mm and reactive  Neck: Neck supple.  Cardiovascular: Normal rate, regular rhythm and normal heart sounds.   No  murmur heard. Pulmonary/Chest: Effort normal and breath sounds normal. No respiratory distress. He has no wheezes.  Abdominal: Soft. Bowel sounds are normal. There is no tenderness. There is no rebound.  Musculoskeletal: He exhibits no edema.  Lymphadenopathy:    He has no cervical adenopathy.  Neurological: He is alert and oriented to person, place, and time. He displays normal reflexes. No cranial nerve deficit.  5 out of 5 strength in all 4 extremities, gait not tested  Skin: Skin is warm and dry.  Psychiatric:  intoxicated    ED Course  Procedures (including critical care time) Labs Review Labs Reviewed - No data to display Imaging Review No results found.   EKG Interpretation None      MDM   Final diagnoses:  None    Patient presents with alcohol intoxication. He is awake, alert, and oriented. He does smell of alcohol and has slurring of speech consistent with acute intoxication. He is otherwise nonfocal. He has no physical complaints at this time. He is able to tolerate fluids by mouth. At this time and do not feel any workup is indicated. Will allow patient to metabolize. Repeat vital signs notable for tachycardia to 117. Will give normal saline bolus. Patient responded to fluids with repeat heart rate of 100. He was able to tolerate by mouth intake and was ambulated prior to discharge at his baseline.  He is awake, alert, and oriented. He continues to be without complaint. Discussed with the patient whether he would like help with his alcohol abuse. At this time he understands that he has a problem but does not wish to seek help.  After history, exam, and medical workup I feel the patient has been appropriately medically screened and is safe for discharge home. Pertinent diagnoses were discussed with the patient. Patient was given return precautions.     Shon Batonourtney F Malorie Bigford, MD 02/12/14 [phone]

## 2014-02-12 NOTE — ED Notes (Signed)
Pt ambulatory with steady gait. Alert and oriented.

## 2014-02-12 NOTE — Discharge Instructions (Signed)
Alcohol Intoxication °Alcohol intoxication occurs when the amount of alcohol that a person has consumed impairs his or her ability to mentally and physically function. Alcohol directly impairs the normal chemical activity of the brain. Drinking large amounts of alcohol can lead to changes in mental function and behavior, and it can cause many physical effects that can be harmful.  °Alcohol intoxication can range in severity from mild to very severe. Various factors can affect the level of intoxication that occurs, such as the person's age, gender, weight, frequency of alcohol consumption, and the presence of other medical conditions (such as diabetes, seizures, or heart conditions). Dangerous levels of alcohol intoxication may occur when people drink large amounts of alcohol in a short period (binge drinking). Alcohol can also be especially dangerous when combined with certain prescription medicines or "recreational" drugs. °SIGNS AND SYMPTOMS °Some common signs and symptoms of mild alcohol intoxication include: °· Loss of coordination. °· Changes in mood and behavior. °· Impaired judgment. °· Slurred speech. °As alcohol intoxication progresses to more severe levels, other signs and symptoms will appear. These may include: °· Vomiting. °· Confusion and impaired memory. °· Slowed breathing. °· Seizures. °· Loss of consciousness. °DIAGNOSIS  °Your health care provider will take a medical history and perform a physical exam. You will be asked about the amount and type of alcohol you have consumed. Blood tests will be done to measure the concentration of alcohol in your blood. In many places, your blood alcohol level must be lower than 80 mg/dL (0.08%) to legally drive. However, many dangerous effects of alcohol can occur at much lower levels.  °TREATMENT  °People with alcohol intoxication often do not require treatment. Most of the effects of alcohol intoxication are temporary, and they go away as the alcohol naturally  leaves the body. Your health care provider will monitor your condition until you are stable enough to go home. Fluids are sometimes given through an IV access tube to help prevent dehydration.  °HOME CARE INSTRUCTIONS °· Do not drive after drinking alcohol. °· Stay hydrated. Drink enough water and fluids to keep your urine clear or pale yellow. Avoid caffeine.   °· Only take over-the-counter or prescription medicines as directed by your health care provider.   °SEEK MEDICAL CARE IF:  °· You have persistent vomiting.   °· You do not feel better after a few days. °· You have frequent alcohol intoxication. Your health care provider can help determine if you should see a substance use treatment counselor. °SEEK IMMEDIATE MEDICAL CARE IF:  °· You become shaky or tremble when you try to stop drinking.   °· You shake uncontrollably (seizure).   °· You throw up (vomit) blood. This may be bright red or may look like black coffee grounds.   °· You have blood in your stool. This may be bright red or may appear as a black, tarry, bad smelling stool.   °· You become lightheaded or faint.   °MAKE SURE YOU:  °· Understand these instructions. °· Will watch your condition. °· Will get help right away if you are not doing well or get worse. °Document Released: 08/10/2005 Document Revised: 07/03/2013 Document Reviewed: 04/05/2013 °[employer]® Patient Information ©2014 [employer]. ° °

## 2014-09-26 ENCOUNTER — Encounter (HOSPITAL_BASED_OUTPATIENT_CLINIC_OR_DEPARTMENT_OTHER): Payer: Self-pay

## 2014-09-26 ENCOUNTER — Emergency Department (HOSPITAL_BASED_OUTPATIENT_CLINIC_OR_DEPARTMENT_OTHER)
Admission: EM | Admit: 2014-09-26 | Discharge: 2014-09-26 | Disposition: A | Discharge status: Home / Self Care | Payer: BC Managed Care – PPO | Attending: Emergency Medicine | Admitting: Emergency Medicine

## 2014-09-26 DIAGNOSIS — F1210 Cannabis abuse, uncomplicated: Secondary | ICD-10-CM | POA: Insufficient documentation

## 2014-09-26 DIAGNOSIS — I10 Essential (primary) hypertension: Secondary | ICD-10-CM | POA: Diagnosis not present

## 2014-09-26 DIAGNOSIS — E780 Pure hypercholesterolemia: Secondary | ICD-10-CM | POA: Insufficient documentation

## 2014-09-26 DIAGNOSIS — Z79899 Other long term (current) drug therapy: Secondary | ICD-10-CM | POA: Diagnosis not present

## 2014-09-26 DIAGNOSIS — Z720 Tobacco use: Secondary | ICD-10-CM | POA: Insufficient documentation

## 2014-09-26 DIAGNOSIS — F1010 Alcohol abuse, uncomplicated: Secondary | ICD-10-CM | POA: Insufficient documentation

## 2014-09-26 LAB — CBC WITH DIFFERENTIAL/PLATELET
Basophils Absolute: 0 10*3/uL (ref 0.0–0.1)
Basophils Relative: 0 % (ref 0–1)
Eosinophils Absolute: 0.1 10*3/uL (ref 0.0–0.7)
Eosinophils Relative: 1 % (ref 0–5)
HCT: 46.7 % (ref 39.0–52.0)
Hemoglobin: 15.9 g/dL (ref 13.0–17.0)
LYMPHS ABS: 4.6 10*3/uL — AB (ref 0.7–4.0)
LYMPHS PCT: 37 % (ref 12–46)
MCH: 30.6 pg (ref 26.0–34.0)
MCHC: 34 g/dL (ref 30.0–36.0)
MCV: 90 fL (ref 78.0–100.0)
Monocytes Absolute: 0.5 10*3/uL (ref 0.1–1.0)
Monocytes Relative: 4 % (ref 3–12)
NEUTROS PCT: 58 % (ref 43–77)
Neutro Abs: 7.2 10*3/uL (ref 1.7–7.7)
PLATELETS: 270 10*3/uL (ref 150–400)
RBC: 5.19 MIL/uL (ref 4.22–5.81)
RDW: 14.2 % (ref 11.5–15.5)
WBC: 12.5 10*3/uL — AB (ref 4.0–10.5)

## 2014-09-26 LAB — COMPREHENSIVE METABOLIC PANEL
ALT: 23 U/L (ref 0–53)
AST: 19 U/L (ref 0–37)
Albumin: 4.4 g/dL (ref 3.5–5.2)
Alkaline Phosphatase: 76 U/L (ref 39–117)
Anion gap: 19 — ABNORMAL HIGH (ref 5–15)
BUN: 19 mg/dL (ref 6–23)
CO2: 25 meq/L (ref 19–32)
Calcium: 9.5 mg/dL (ref 8.4–10.5)
Chloride: 97 mEq/L (ref 96–112)
Creatinine, Ser: 0.7 mg/dL (ref 0.50–1.35)
GLUCOSE: 110 mg/dL — AB (ref 70–99)
Potassium: 4.1 mEq/L (ref 3.7–5.3)
SODIUM: 141 meq/L (ref 137–147)
Total Bilirubin: <0.2 mg/dL — ABNORMAL LOW (ref 0.3–1.2)
Total Protein: 8.1 g/dL (ref 6.0–8.3)

## 2014-09-26 LAB — RAPID URINE DRUG SCREEN, HOSP PERFORMED
AMPHETAMINES: NOT DETECTED
Barbiturates: NOT DETECTED
Benzodiazepines: NOT DETECTED
Cocaine: NOT DETECTED
Opiates: NOT DETECTED
TETRAHYDROCANNABINOL: POSITIVE — AB

## 2014-09-26 LAB — URINALYSIS, ROUTINE W REFLEX MICROSCOPIC
Bilirubin Urine: NEGATIVE
GLUCOSE, UA: NEGATIVE mg/dL
HGB URINE DIPSTICK: NEGATIVE
Ketones, ur: NEGATIVE mg/dL
Leukocytes, UA: NEGATIVE
Nitrite: NEGATIVE
PROTEIN: 30 mg/dL — AB
Specific Gravity, Urine: 1.005 (ref 1.005–1.030)
Urobilinogen, UA: 0.2 mg/dL (ref 0.0–1.0)
pH: 7 (ref 5.0–8.0)

## 2014-09-26 LAB — LIPASE, BLOOD: Lipase: 38 U/L (ref 11–59)

## 2014-09-26 LAB — URINE MICROSCOPIC-ADD ON

## 2014-09-26 LAB — ETHANOL: Alcohol, Ethyl (B): 295 mg/dL — ABNORMAL HIGH (ref 0–11)

## 2014-09-26 MED ORDER — ONDANSETRON HCL 4 MG/2ML IJ SOLN
4.0000 mg | Freq: Once | INTRAMUSCULAR | Status: AC
  Administered 2014-09-26: 4 mg via INTRAVENOUS
  Filled 2014-09-26: qty 2

## 2014-09-26 NOTE — ED Notes (Signed)
Pt noted leaving with IV in arm. Redirected back to room to have IV removed by Tresa EndoKelly, EMT.

## 2014-09-26 NOTE — BH Assessment (Signed)
Tele Assessment Note   Marvin Cooper is an 43 y.o. male that was seen this day via tele assessment by this clinician.  [facility] requesting alcohol detox.  He was referred by his PCP by report.  However, upon assessment, pt stated he doesn't want detox, that he wants to follow up with [facility] and AA.  Pt reported he has been on a "binge," and has missed the past 2 days of work.  He reported he has been drinking daily for months after being sober 90 days last year and attending both inpatient and outpatient SA detox/treatment, but that he has been drinking heavily over the last 3 days.  He reported he had some alcohol prior this AM because he was feeling nauseous.  He reported he drank 1 pint of vodka dn 4-6 beers last night and also reports daily use of marijuana.  Pt endorses sx of depression.  Pt denies any current triggers/stressors to drinking.  He stated he has a "great job and a great family."  Pt stated he has a lot of support.  Pt stated he decided to not go to work and drink and it "made me so sick."  Pt stated he was vomiting and felt like he was having a panic attack, so he went to his PCP who referred him to ED.  Pt denies SI, HI or AVH.  No delusions noted.  Pt requesting to go home.  Pt calm, cooperative, oriented x 4, had normal speech, logical/coherent thought processes, depressed mood.  Pt denies current withdrawal sx from alcohol.  Pt stated he is prescribed anti-anxiety medication from his PCP.  Consulted with NP Pickering @ [phone] and informed her pt requesting to leave.  Pt to be discharged and follow up with SA OP provider per his request, although detox was offered to the pt.  Updated Marvin CoyerEric Cooper, [facility] of pt disposition as well as TTS staff.  Axis I: 303.90 Alcohol Use Disorder, Severe, 305.20 Cannabis Use Disorder, Severe, 311 Unspecified Depressive Disorder Axis II: Deferred Axis III:  Past Medical History  Diagnosis Date  . Hypertension    . Hypercholesteremia    Axis IV: occupational problems and other psychosocial or environmental problems Axis V: 41-50 serious symptoms  Past Medical History:  Past Medical History  Diagnosis Date  . Hypertension   . Hypercholesteremia     Past Surgical History  Procedure Laterality Date  . Knee surgery    . Leg surgery Left Age 43    ORIF lower leg  . Adenoidectomy  Age 31  . Shoulder arthroscopy      Family History:  Family History  Problem Relation Age of Onset  . Alcohol abuse Brother   . Alcohol abuse Sister   . Alcohol abuse Maternal Grandfather     Social History:  reports that he has been smoking Cigarettes.  He has a 20 pack-year smoking history. He has never used smokeless tobacco. He reports that he drinks about 48.0 oz of alcohol per week. He reports that he uses illicit drugs (Marijuana).  Additional Social History:  Alcohol / Drug Use Pain Medications: see med list Prescriptions: see med list Over the Counter: see med list History of alcohol / drug use?: Yes Longest period of sobriety (when/how long): 90 days last year Negative Consequences of Use: Work / School Withdrawal Symptoms:  (pt denies current sx) Substance #1 Name of Substance 1: Alcohol 1 - Age of First  Use: 12 1 - Amount (size/oz): 1 pint of vodka plus beer 1 - Frequency: daily 1 - Duration: ongoing for 3 mos  1 - Last Use / Amount: today - 1 pint vodka and 4-5 beers Substance #2 Name of Substance 2: Marijuana 2 - Age of First Use: teens 2 - Amount (size/oz): "2 tokes" 2 - Frequency: daily 2 - Duration: ongoing for years 2 - Last Use / Amount: last night - 2 joints  CIWA: CIWA-Ar BP: 133/85 mmHg Pulse Rate: 100 Nausea and Vomiting: intermittent nausea with dry heaves Tactile Disturbances: very mild itching, pins and needles, burning or numbness Tremor: not visible, but can be felt fingertip to fingertip Auditory Disturbances: not present Paroxysmal Sweats: barely perceptible  sweating, palms moist Visual Disturbances: not present Anxiety: moderately anxious, or guarded, so anxiety is inferred Headache, Fullness in Head: none present Agitation: somewhat more than normal activity Orientation and Clouding of Sensorium: oriented and can do serial additions CIWA-Ar Total: 12 COWS:    PATIENT STRENGTHS: (choose at least two) Ability for insight Average or above average intelligence Capable of independent living MetallurgistCommunication skills Financial means General fund of knowledge Motivation for treatment/growth Supportive family/friends Work skills  Allergies: No Known Allergies  Home Medications:  (Not in a hospital admission)  OB/GYN Status:  No LMP for male patient.  General Assessment Data Location of Assessment:  ([facility]) Is this a Tele or Face-to-Face Assessment?: Tele Assessment Is this an Initial Assessment or a Re-assessment for this encounter?: Initial Assessment Living Arrangements: Spouse/significant other, Children Can pt return to current living arrangement?: Yes Admission Status: Voluntary Is patient capable of signing voluntary admission?: Yes Transfer from: Other (Comment) (PCP) Referral Source: MD     [facility] Living Arrangements: Spouse/significant other, Children Name of Psychiatrist: none Name of Therapist: none  Education Status Is patient currently in school?: No  Risk to self with the past 6 months Suicidal Ideation: No Suicidal Intent: No Is patient at risk for suicide?: No Suicidal Plan?: No Access to Means: No What has been your use of drugs/alcohol within the last 12 months?: pt reports daily alcohol and marijuana use Previous Attempts/Gestures: No How many times?: 0 Other Self Harm Risks: na - pt denies Triggers for Past Attempts: None known Intentional Self Injurious Behavior: None Family Suicide History: Yes (cousin committed suicide) Recent stressful life event(s): Other (Comment)  (SA, depression, missing work) Persecutory voices/beliefs?: No Depression: Yes Depression Symptoms: Despondent, Feeling worthless/self pity Substance abuse history and/or treatment for substance abuse?: Yes Suicide prevention information given to non-admitted patients: Not applicable  Risk to Others within the past 6 months Homicidal Ideation: No Thoughts of Harm to Others: No Current Homicidal Intent: No Current Homicidal Plan: No Access to Homicidal Means: No Identified Victim: na - pt denies History of harm to others?: No Assessment of Violence: None Noted Violent Behavior Description: na - pt calm, cooperative Does patient have access to weapons?: No Criminal Charges Pending?: No Does patient have a court date: No  Psychosis Hallucinations: None noted Delusions: None noted  Mental Status Report Appear/Hygiene: Disheveled Eye Contact: Fair Motor Activity: Freedom of movement, Unremarkable Speech: Logical/coherent Level of Consciousness: Alert Mood: Depressed Affect: Depressed Anxiety Level: Minimal Thought Processes: Coherent, Relevant Judgement: Impaired Orientation: Person, Place, Time, Situation Obsessive Compulsive Thoughts/Behaviors: None  Cognitive Functioning Concentration: Normal Memory: Recent Intact, Remote Intact IQ: Average Insight: Fair Impulse Control: Poor Appetite: Fair Weight Loss: 0 Weight Gain: 15 Sleep: No Change Total Hours  of Sleep:  (only sleeps if drinks alcohol) Vegetative Symptoms: None  ADLScreening [facility] Assessment Services) Patient's cognitive ability adequate to safely complete daily activities?: Yes Patient able to express need for assistance with ADLs?: Yes Independently performs ADLs?: Yes (appropriate for developmental age)  Prior Inpatient Therapy Prior Inpatient Therapy: Yes Prior Therapy Dates: unknown dates and 2014 Prior Therapy Facilty[facility]Provider([facility] Reason for Treatment: SA  Prior Outpatient  Therapy Prior Outpatient Therapy: Yes Prior Therapy Dates: 2014 Prior Therapy Facilty/Provider(s): [facility] Reason for Treatment: SA  ADL Screening (condition at time of admission) Patient's cognitive ability adequate to safely complete daily activities?: Yes Is the patient deaf or have difficulty hearing?: No Does the patient have difficulty seeing, even when wearing glasses/contacts?: No Does the patient have difficulty concentrating, remembering, or making decisions?: No Patient able to express need for assistance with ADLs?: Yes Does the patient have difficulty dressing or bathing?: No Independently performs ADLs?: Yes (appropriate for developmental age) Does the patient have difficulty walking or climbing stairs?: No  Home Assistive Devices/Equipment Home Assistive Devices/Equipment: None    Abuse/Neglect Assessment (Assessment to be complete while patient is alone) Physical Abuse: Denies Verbal Abuse: Denies Sexual Abuse: Denies Exploitation of patient/patient's resources: Denies Self-Neglect: Denies Values / Beliefs Cultural Requests During Hospitalization: None Spiritual Requests During Hospitalization: None Consults Spiritual Care Consult Needed: No Social Work Consult Needed: No Merchant navy officer (For Healthcare) Does patient have an advance directive?: No Would patient like information on creating an advanced directive?: No - patient declined information    Additional Information 1:1 In Past 12 Months?: No CIRT Risk: No Elopement Risk: No Does patient have medical clearance?: Yes     Disposition:  Disposition Initial Assessment Completed for this Encounter: Yes Disposition of Patient: Referred to, Outpatient treatment Type of outpatient treatment: Adult Patient referred to: Outpatient clinic referral  Casimer Lanius, MS, [facility] Licensed Professional Counselor Therapeutic Triage Specialist [facility] Phone:  [phone] Fax: [phone]  09/26/2014 4:25 PM

## 2014-09-26 NOTE — ED Notes (Signed)
Pt reports recovering alcoholic and has been binge drinking for the past 4 days.  Reports he is in a "bad place".  Denies SI/HI.  Pt reports he has been drinking 4 bottles a vodka a day.  Went to PCP today and they sent him here.  Pt reports he can't go through the withdraws on his own.  Pt reports he vomited for 2 days. Reports shakiness, n/v.  Pt is tearful in triage.  Reports he has to stop because he has the best job, family, and friends.  Pt smells of ETOH at the time.

## 2014-09-26 NOTE — ED Notes (Signed)
[facility].

## 2014-09-26 NOTE — Discharge Instructions (Signed)
Alcohol Intoxication °Alcohol intoxication occurs when you drink enough alcohol that it affects your ability to function. It can be mild or very severe. Drinking a lot of alcohol in a short time is called binge drinking. This can be very harmful. Drinking alcohol can also be more dangerous if you are taking medicines or other drugs. Some of the effects caused by alcohol may include: °· Loss of coordination. °· Changes in mood and behavior. °· Unclear thinking. °· Trouble talking (slurred speech). °· Throwing up (vomiting). °· Confusion. °· Slowed breathing. °· Twitching and shaking (seizures). °· Loss of consciousness. °HOME CARE °· Do not drive after drinking alcohol. °· Drink enough water and fluids to keep your pee (urine) clear or pale yellow. Avoid caffeine. °· Only take medicine as told by your doctor. °GET HELP IF: °· You throw up (vomit) many times. °· You do not feel better after a few days. °· You frequently have alcohol intoxication. Your doctor can help decide if you should see a substance use treatment counselor. °GET HELP RIGHT AWAY IF: °· You become shaky when you stop drinking. °· You have twitching and shaking. °· You throw up blood. It may look bright red or like coffee grounds. °· You notice blood in your poop (bowel movements). °· You become lightheaded or pass out (faint). °MAKE SURE YOU:  °· Understand these instructions. °· Will watch your condition. °· Will get help right away if you are not doing well or get worse. °Document Released: 04/18/2008 Document Revised: 07/03/2013 Document Reviewed: 04/05/2013 °ExitCare® Patient Information ©2015 [employer]. This information is not intended to replace advice given to you by your health care provider. Make sure you discuss any questions you have with your health care provider. ° ° °Emergency Department Resource Guide °1) Find a Doctor and Pay Out of Pocket °Although you won't have to find out who is covered by your insurance plan, it is a good  idea to ask around and get recommendations. You will then need to call the office and see if the doctor you have chosen will accept you as a new patient and what types of options they offer for patients who are self-pay. Some doctors offer discounts or will set up payment plans for their patients who do not have insurance, but you will need to ask so you aren't surprised when you get to your appointment. ° °2) Contact Your Local Health Department °Not all health departments have doctors that can see patients for sick visits, but many do, so it is worth a call to see if yours does. If you don't know where your local health department is, you can check in your phone book. The CDC also has a tool to help you locate your state's health department, and many state websites also have listings of all of their local health departments. ° °3) Find a Walk-in Clinic °If your illness is not likely to be very severe or complicated, you may want to try a walk in clinic. These are popping up all over the country in pharmacies, drugstores, and shopping centers. They're usually staffed by nurse practitioners or physician assistants that have been trained to treat common illnesses and complaints. They're usually fairly quick and inexpensive. However, if you have serious medical issues or chronic medical problems, these are probably not your best option. ° °No Primary Care Doctor: °- Call [facility] Connect at  832-8000 - they can help you locate a primary care doctor that  accepts your insurance, provides   certain services, etc. °- Physician Referral Service- 1-800-533-3463 ° °Chronic Pain Problems: °Organization         Address  Phone   Notes  °[facility]  (336) 297-2271 Patients need to be referred by their primary care doctor.  ° °Medication Assistance: °Organization         Address  Phone   Notes  °[place] [facility] [address] °[place], [place] [address] (336) 641-8030  --Must be a resident of [place] °-- Must have NO insurance coverage whatsoever (no Medicaid/ Medicare, etc.) °-- The pt. MUST have a primary care doctor that directs their care regularly and follows them in the community °  °[facility]  (866) 331-1348   °[employer]  (888) 892-1162   ° °Agencies that provide inexpensive medical care: °Organization         Address  Phone   Notes  °[facility]  (336) 832-8035   °[facility]    (336) 832-7272   °[facility] [address] °[place], [place] [address] (336) 832-4777   °[facility] [address], °[place] (336) 271-4999   °[facility]    (336) 373-0678   °[facility]    (336) 272-1050   °[facility] ° [address], [place] Phone:  (336) 832-4444, Fax:  (336) 832-4440 Hours of Operation:  9 am - 6 pm, M-F.  Also accepts Medicaid/Medicare and self-pay.  °[facility] ° [address], [place] Phone: (336) 832-3150, Fax: (336) 832-3151. Hours of Operation:  8:30 am - 5:30 pm, M-F.  Also accepts Medicaid and self-pay.  °[facility] [place] [address], [place] Phone: (336) 878-6027   °[facility] [address], [place], [place] (336)723-1848, Ext. 123 Mondays & Thursdays: 7-9 AM.  First 15 patients are seen on a first come, first serve basis. °  ° °Medicaid-accepting [place] Providers: ° °Organization         Address  Phone   Notes  °[facility] [address], [place] (336) 641-2100 Also accepts self-pay patients.  °[facility] [address], [place] ° (336) 856-9996   °[facility] [address], [place] (336) 288-8857   °[facility] [address] [place] [address], [place] (336) 299-7000   °Veita Bland [address], [place]  ° (336) 373-1557 Only  accepts [place] Access Medicaid patients after they have their name applied to their card.  ° °Self-Pay (no insurance) in [place]: ° °Organization         Address  Phone   Notes  °Sickle Cell Patients, [facility] [address], [place] (336) 832-1970   °[facility] [address], [place] (336) 832-4400   °[facility] ° [address],  (336) 992-4800   °[facility]/Dr. Osei-Bonsu ° [address] [place] [address], [place] or [address], [place] (336) 841-8500 Phone number for both [place] and [place] locations is the same.  °[facility] [address], [place] (336) 299-0000   °[facility] [address] [place] [address], [place] or [address] (336) 852-7530 °(336) 878-2260   °[facility] [address], [place] (336) 350-1642, phone; (336) 294-5005, fax Sees patients 1st and 3rd Saturday of every month.  Must not qualify for public or private insurance (i.e. Medicaid, Medicare, [place] Health Choice, Veterans' Benefits) • Household income should be no more than 200% of the poverty level •The clinic cannot treat you if you are pregnant or think you are pregnant • Sexually transmitted diseases are not treated at the clinic.  ° ° °Dental Care: °Organization         Address  Phone  Notes  °[facility] [facility] [address], [place] (336) 641-6152 Accepts children up to age 21 who are enrolled in Medicaid or [place] Health Choice; pregnant women with a Medicaid card; and children who have applied for Medicaid or [place] Health Choice, but were declined, whose parents can pay a reduced fee at time of service.  °[facility] [place]  [address], [place] (336) 641-7733 Accepts children up to age 21 who are enrolled in Medicaid or [place] Health Choice; pregnant  women with a Medicaid card; and children who have applied for Medicaid or [place] Health Choice, but were declined, whose parents can pay a reduced fee at time of service.  °[facility] ° [address], [place] (336) 641-4533 Patients are seen by appointment only. Walk-ins are not accepted. [facility] will see patients 18 years of age and older. °Monday - Tuesday (8am-5pm) °Most Wednesdays (8:30-5pm) °$30 per visit, cash only  °[facility] ° [address], [place] (336) 641-4533 Patients are seen by appointment only. Walk-ins are not accepted. [facility] will see patients 18 years of age and older. °One Wednesday Evening (Monthly: Volunteer Based).  $30 per visit, cash only  °[facility]  (919) 537-3737 for adults; Children under age 4, call Graduate Pediatric Dentistry at (919) [phone]. Children aged 4-14, please call (919) [phone] to request a pediatric application. ° Dental services are provided in all areas of dental care including fillings, crowns and bridges, complete and partial dentures, implants, gum treatment, root canals, and extractions. Preventive care is also provided. Treatment is provided to both adults and children. °Patients are selected via a lottery and there is often a waiting list. °  °[facility] [address], °[place] ° (336) 763-8833 [email or web address] °  °[facility] [address], [place], [place] (336)723-1848, Ext. 123 Second and Fourth Thursday of each month, opens at 6:30 AM; Clinic ends at 9 AM.  Patients are seen on a first-come first-served basis, and a limited number are seen during each clinic.  ° °[facility] ° [address], [place], [place] (336) 723-7904   Eligibility Requirements °You must have lived in [place], [place], or [place] counties for at least the last three months. °  You cannot be eligible for state or federal sponsored  healthcare insurance, including Veterans Administration, Medicaid, or Medicare. °  You generally cannot be eligible for healthcare insurance through your employer.  °  How to apply: °Eligibility screenings are held every Tuesday and Wednesday afternoon from 1:00 pm until 4:00 pm. You do not need an appointment for the interview!  °[facility] [address], [place],  336-631-2330   °[place] [facility]  336-342-8273   °[facility]  336-703-3100   °[facility]  336-570-6415   ° °  Behavioral Health Resources in the Community: °Intensive Outpatient Programs °Organization         Address  Phone  Notes  °[facility] [address], [place], [place] 336-878-6098   °[facility] [address], [place], [place] 336-832-9800   °[facility] [address], [place], [place] ° 336-882-2125   °[facility] [address],  °[place], [place] 1-800-853-5163 or 336-641-4981   °Substance Abuse Resources °Organization         Address  Phone  Notes  °[facility]  336-882-2125   °[facility]  336-784-9470   °[facility]  336-285-9073   °[facility]  336-845-3988   °Residential & Outpatient Substance Abuse Program  1-800-659-3381   °Psychological Services °Organization         Address  Phone  Notes  °[facility]  336- 832-9600   °[facility]  336- 378-7881   °[facility] [address], [place] 1-800-853-5163 or 336-641-4981   ° °Mobile Crisis Teams °Organization         Address  Phone  Notes  °[facility], Mobile Crisis Care Unit  1-877-626-1772   °Assertive °[facility] ° [address] [place], [place] 336-834-9664   °Sharon DeEsch [address] °[place] 336-554-5454   ° °Self-Help/Support Groups °Organization         Address  Phone              Notes  °[facility] [place] - variety of support groups  336- 373-1402 Call for more information  °Narcotics Anonymous (NA), [facility] [address], °[place]  2 meetings at this location  ° °Residential Treatment Programs °Organization         Address  Phone  Notes  °[facility] [address],    °[place]  1-866-801-8205   °[facility] ° [address], [place], [place] 704-293-8524   °[facility] [address], [place] 336-845-3988 Admissions: 8am-3pm M-F  °[facility] [address]    °[place], [place] 336-841-1104   °[facility] [address], [place], [place] 336-379-7146   °[facility] [address]  °[place], [place] 336-285-9073   °[facility] - Intensive Outpatient [address], , [place] 336-852-3033   °[facility] [address]  °[place], [place] 1-877-615-2722 or 336-784-9470   °[facility] [address] [place], [place] 336-227-7417 Accepts Medicaid  °[facility] [address]  ° [place] 1-800-659-3381 Substance Abuse/Addiction Treatment  ° °[place] Behavioral Health Resources °Organization         Address  Phone  Notes  °[facility]  (888) 581-9988   °Julie Brannon, PhD [address] [place], [place]   (336) 349-5553 or (336) 951-0000   °[facility]   [address] °[place], [place] (336) 349-4454   °[facility] [address], [place], [place] (336) 342-8316 Insurance/Medicaid/sponsorship through [facility]  °[facility] [address]                                    [place],  (336) 342-8316 Therapy/tele-psych/case  °[facility] [address]  ° [place],   [place] (336) 349-2233    °Dr. Arfeen  (336) 349-4544   °[facility] [place]  [employer] [place] [facility] 1) [address], [place] °2) [address], [place] °3)  [address] [place] [address], [place] (336) 349-3220 °(336) 342-7768 ° °(336) 342-8140   °[place] [facility] (336) 342-1394 or (336) 342-3537 (After Hours)    ° ° ° °

## 2014-09-26 NOTE — ED Provider Notes (Signed)
CSN: [id number]     Arrival date & time 09/26/14  1401 History   First MD Initiated Contact with Patient 09/26/14 1416     Chief Complaint  Patient presents with  . Drug / Alcohol Assessment     (Consider location/radiation/quality/duration/timing/severity/associated sxs/prior Treatment) HPI Comments: Pt comes in today for help with etoh abuse. Denies si/hi. He states that he went to see his pcp and was sent her. Pt states that he went on a "bender or drinking" for a couple of days. He states that he started vomiting 3 days ago that he couldn't control so he decided to start drinking again. Pt [facility]. States that he drinks about a fifth of vodka a day  The history is provided by the patient. No language interpreter was used.    Past Medical History  Diagnosis Date  . Hypertension   . Hypercholesteremia    Past Surgical History  Procedure Laterality Date  . Knee surgery    . Leg surgery Left Age [age]    ORIF lower leg  . Adenoidectomy  Age 70  . Shoulder arthroscopy     Family History  Problem Relation Age of Onset  . Alcohol abuse Brother   . Alcohol abuse Sister   . Alcohol abuse Maternal Grandfather    History  Substance Use Topics  . Smoking status: Current Every Day Smoker -- 1.00 packs/day for 20 years    Types: Cigarettes  . Smokeless tobacco: Never Used  . Alcohol Use: 48.0 oz/week    80 Cans of beer per week    Review of Systems  All other systems reviewed and are negative.     Allergies  Review of patient's allergies indicates no known allergies.  Home Medications   Prior to Admission medications   Medication Sig Start Date End Date Taking? Authorizing Provider  albuterol (PROVENTIL HFA;VENTOLIN HFA) 108 (90 BASE) MCG/ACT inhaler Inhale 2 puffs into the lungs every 4 (four) hours as needed for wheezing or shortness of breath. 07/15/13   Sanjuana KavaAgnes I Nwoko, NP  benazepril (LOTENSIN) 40 MG tablet Take 1 tablet (40  mg total) by mouth daily. For high blood pressure control 07/15/13   Sanjuana KavaAgnes I Nwoko, NP  cetirizine (ZYRTEC) 10 MG tablet Take 1 tablet (10 mg total) by mouth daily. For allergies 07/15/13   Sanjuana KavaAgnes I Nwoko, NP  citalopram (CELEXA) 20 MG tablet Take 1 tablet (20 mg total) by mouth daily. For depression 07/15/13   Sanjuana KavaAgnes I Nwoko, NP  Fenofibrate 150 MG CAPS Take 150 mg by mouth daily. For cholesterol control 07/15/13   Sanjuana KavaAgnes I Nwoko, NP  hydrOXYzine (VISTARIL) 25 MG capsule Take 25 mg by mouth 4 (four) times daily.    Historical Provider, MD  niacin 500 MG CR capsule Take 1,000 mg by mouth at bedtime. For low niacin 07/15/13   Sanjuana KavaAgnes I Nwoko, NP  omega-3 acid ethyl esters (LOVAZA) 1 G capsule Take 2 capsules (2 g total) by mouth 2 (two) times daily. For cholesterol control 07/15/13   Sanjuana KavaAgnes I Nwoko, NP  simvastatin (ZOCOR) 40 MG tablet Take 1 tablet (40 mg total) by mouth at bedtime. For high cholesterol control 07/15/13   Sanjuana KavaAgnes I Nwoko, NP  traZODone (DESYREL) 50 MG tablet Take 1 tablet (50 mg total) by mouth at bedtime as needed and may repeat dose one time if needed for sleep. For sleep 07/15/13   Sanjuana KavaAgnes I Nwoko, NP   BP  148/98 mmHg  Pulse 124  Temp(Src) 99.3 F (37.4 C) (Oral)  Resp 22  Ht 5\' 7"  (1.702 m)  Wt 210 lb (95.255 kg)  BMI 32.88 kg/m2  SpO2 92% Physical Exam  Constitutional: He is oriented to person, place, and time. He appears well-developed and well-nourished.  HENT:  Head: Normocephalic and atraumatic.  Cardiovascular: Normal rate and regular rhythm.   Pulmonary/Chest: Effort normal and breath sounds normal.  Abdominal: Soft. Bowel sounds are normal. There is no tenderness.  Musculoskeletal: Normal range of motion.  Neurological: He is alert and oriented to person, place, and time.  Skin: Skin is warm and dry.  Psychiatric: He has a normal mood and affect.  Nursing note and vitals reviewed.   ED Course  Procedures (including critical care time) Labs Review Labs Reviewed  URINALYSIS,  ROUTINE W REFLEX MICROSCOPIC - Abnormal; Notable for the following:    Protein, ur 30 (*)    All other components within normal limits  URINE RAPID DRUG SCREEN (HOSP PERFORMED) - Abnormal; Notable for the following:    Tetrahydrocannabinol POSITIVE (*)    All other components within normal limits  CBC WITH DIFFERENTIAL - Abnormal; Notable for the following:    WBC 12.5 (*)    Lymphs Abs 4.6 (*)    All other components within normal limits  COMPREHENSIVE METABOLIC PANEL - Abnormal; Notable for the following:    Glucose, Bld 110 (*)    Total Bilirubin <0.2 (*)    Anion gap 19 (*)    All other components within normal limits  ETHANOL - Abnormal; Notable for the following:    Alcohol, Ethyl (B) 295 (*)    All other components within normal limits  URINE MICROSCOPIC-ADD ON - Abnormal; Notable for the following:    Bacteria, UA MANY (*)    All other components within normal limits  LIPASE, BLOOD    Imaging Review No results found.   EKG Interpretation None      MDM   Final diagnoses:  ETOH abuse    Pt spoke with tts and decided that he doesn't want help. Pt not in dt's pt is tolerating po here. Pt no si/hi. Discussed with pt that he could go home only with a ride home. Pt verbalized understanding    Teressa LowerVrinda Hal Norrington, NP 09/26/14 1637  Glynn OctaveStephen Rancour, MD 09/26/14 1718

## 2014-09-26 NOTE — BH Assessment (Signed)
[facility]   [facility] [place], spoke with Teressa LowerVrinda Pickering, NP @ in regard to tele assessment order for the pt and gained clinical information.  Pt's tele assessment scheduled for 1545 with this clinician.  Casimer LaniusKristen Iley Breeden, MS, [facility] Licensed Professional Counselor Therapeutic Triage Specialist [facility] Phone: [phone] Fax: [phone]

## 2014-10-15 ENCOUNTER — Inpatient Hospital Stay (HOSPITAL_COMMUNITY)
Admission: AD | Admit: 2014-10-15 | Discharge: 2014-10-18 | DRG: 897 | Disposition: A | Discharge status: Home / Self Care | Payer: BC Managed Care – PPO | Source: Intra-hospital | Attending: Psychiatry | Admitting: Psychiatry

## 2014-10-15 ENCOUNTER — Encounter (HOSPITAL_COMMUNITY): Payer: Self-pay | Admitting: *Deleted

## 2014-10-15 ENCOUNTER — Encounter (HOSPITAL_COMMUNITY): Payer: Self-pay | Admitting: Emergency Medicine

## 2014-10-15 ENCOUNTER — Emergency Department (HOSPITAL_COMMUNITY)
Admission: EM | Admit: 2014-10-15 | Discharge: 2014-10-15 | Disposition: A | Discharge status: Other Acute Inpatient Hospital | Payer: BC Managed Care – PPO | Attending: Emergency Medicine | Admitting: Emergency Medicine

## 2014-10-15 DIAGNOSIS — Z79899 Other long term (current) drug therapy: Secondary | ICD-10-CM | POA: Insufficient documentation

## 2014-10-15 DIAGNOSIS — Y908 Blood alcohol level of 240 mg/100 ml or more: Secondary | ICD-10-CM | POA: Diagnosis present

## 2014-10-15 DIAGNOSIS — R000 Tachycardia, unspecified: Secondary | ICD-10-CM | POA: Diagnosis not present

## 2014-10-15 DIAGNOSIS — Z720 Tobacco use: Secondary | ICD-10-CM | POA: Insufficient documentation

## 2014-10-15 DIAGNOSIS — F1020 Alcohol dependence, uncomplicated: Secondary | ICD-10-CM | POA: Diagnosis present

## 2014-10-15 DIAGNOSIS — F339 Major depressive disorder, recurrent, unspecified: Secondary | ICD-10-CM | POA: Diagnosis present

## 2014-10-15 DIAGNOSIS — F333 Major depressive disorder, recurrent, severe with psychotic symptoms: Secondary | ICD-10-CM | POA: Diagnosis present

## 2014-10-15 DIAGNOSIS — E780 Pure hypercholesterolemia: Secondary | ICD-10-CM | POA: Insufficient documentation

## 2014-10-15 DIAGNOSIS — I10 Essential (primary) hypertension: Secondary | ICD-10-CM | POA: Diagnosis present

## 2014-10-15 DIAGNOSIS — F1210 Cannabis abuse, uncomplicated: Secondary | ICD-10-CM | POA: Diagnosis not present

## 2014-10-15 DIAGNOSIS — F10229 Alcohol dependence with intoxication, unspecified: Principal | ICD-10-CM | POA: Diagnosis present

## 2014-10-15 DIAGNOSIS — F17210 Nicotine dependence, cigarettes, uncomplicated: Secondary | ICD-10-CM | POA: Diagnosis present

## 2014-10-15 DIAGNOSIS — F1010 Alcohol abuse, uncomplicated: Secondary | ICD-10-CM | POA: Diagnosis present

## 2014-10-15 DIAGNOSIS — F1014 Alcohol abuse with alcohol-induced mood disorder: Secondary | ICD-10-CM | POA: Diagnosis present

## 2014-10-15 HISTORY — DX: Unspecified asthma, uncomplicated: J45.909

## 2014-10-15 HISTORY — DX: Alcohol abuse, uncomplicated: F10.10

## 2014-10-15 LAB — COMPREHENSIVE METABOLIC PANEL
ALT: 35 U/L (ref 0–53)
AST: 50 U/L — ABNORMAL HIGH (ref 0–37)
Albumin: 4.2 g/dL (ref 3.5–5.2)
Alkaline Phosphatase: 79 U/L (ref 39–117)
Anion gap: 18 — ABNORMAL HIGH (ref 5–15)
BUN: 10 mg/dL (ref 6–23)
CO2: 22 mEq/L (ref 19–32)
Calcium: 9 mg/dL (ref 8.4–10.5)
Chloride: 104 mEq/L (ref 96–112)
Creatinine, Ser: 0.73 mg/dL (ref 0.50–1.35)
GFR calc Af Amer: >90 mL/min (ref >90)
GFR calc non Af Amer: >90 mL/min (ref >90)
Glucose, Bld: 107 mg/dL — ABNORMAL HIGH (ref 70–99)
Potassium: 4.2 mEq/L (ref 3.7–5.3)
Sodium: 144 mEq/L (ref 137–147)
Total Bilirubin: <0.2 mg/dL — ABNORMAL LOW (ref 0.3–1.2)
Total Protein: 7.7 g/dL (ref 6.0–8.3)

## 2014-10-15 LAB — RAPID URINE DRUG SCREEN, HOSP PERFORMED
Amphetamines: NOT DETECTED
Barbiturates: NOT DETECTED
Benzodiazepines: NOT DETECTED
Cocaine: NOT DETECTED
Opiates: NOT DETECTED
Tetrahydrocannabinol: POSITIVE — AB

## 2014-10-15 LAB — CBC
HCT: 46.7 % (ref 39.0–52.0)
Hemoglobin: 15.6 g/dL (ref 13.0–17.0)
MCH: 31.2 pg (ref 26.0–34.0)
MCHC: 33.4 g/dL (ref 30.0–36.0)
MCV: 93.4 fL (ref 78.0–100.0)
Platelets: 265 10*3/uL (ref 150–400)
RBC: 5 MIL/uL (ref 4.22–5.81)
RDW: 14.6 % (ref 11.5–15.5)
WBC: 7.9 10*3/uL (ref 4.0–10.5)

## 2014-10-15 LAB — ETHANOL: Alcohol, Ethyl (B): 337 mg/dL — ABNORMAL HIGH (ref 0–11)

## 2014-10-15 LAB — SALICYLATE LEVEL: Salicylate Lvl: <2 mg/dL — ABNORMAL LOW (ref 2.8–20.0)

## 2014-10-15 MED ORDER — CITALOPRAM HYDROBROMIDE 20 MG PO TABS
40.0000 mg | ORAL_TABLET | Freq: Every day | ORAL | Status: DC
Start: 2014-10-15 — End: 2014-10-15
  Administered 2014-10-15: 40 mg via ORAL
  Filled 2014-10-15: qty 2

## 2014-10-15 MED ORDER — LORAZEPAM 1 MG PO TABS
1.0000 mg | ORAL_TABLET | Freq: Four times a day (QID) | ORAL | Status: DC | PRN
  Administered 2014-10-17: 1 mg via ORAL
  Filled 2014-10-15: qty 1

## 2014-10-15 MED ORDER — LORAZEPAM 1 MG PO TABS: 1.0000 mg | ORAL_TABLET | Freq: Every day | ORAL | Status: DC

## 2014-10-15 MED ORDER — LORAZEPAM 1 MG PO TABS
1.0000 mg | ORAL_TABLET | Freq: Four times a day (QID) | ORAL | Status: AC
  Administered 2014-10-15 – 2014-10-16 (×5): 1 mg via ORAL
  Filled 2014-10-15 (×5): qty 1

## 2014-10-15 MED ORDER — LORAZEPAM 1 MG PO TABS
0.0000 mg | ORAL_TABLET | Freq: Four times a day (QID) | ORAL | Status: DC
  Administered 2014-10-15 (×2): 1 mg via ORAL
  Filled 2014-10-15 (×2): qty 1

## 2014-10-15 MED ORDER — LORATADINE 10 MG PO TABS
10.0000 mg | ORAL_TABLET | Freq: Every day | ORAL | Status: DC
  Administered 2014-10-15: 10 mg via ORAL
  Filled 2014-10-15: qty 1

## 2014-10-15 MED ORDER — ONDANSETRON 4 MG PO TBDP
4.0000 mg | ORAL_TABLET | Freq: Four times a day (QID) | ORAL | Status: DC | PRN
  Administered 2014-10-16: 4 mg via ORAL
  Filled 2014-10-15: qty 1

## 2014-10-15 MED ORDER — LORATADINE 10 MG PO TABS
10.0000 mg | ORAL_TABLET | Freq: Every day | ORAL | Status: DC
  Administered 2014-10-16 – 2014-10-18 (×3): 10 mg via ORAL
  Filled 2014-10-15: qty 1
  Filled 2014-10-15: qty 4
  Filled 2014-10-15 (×3): qty 1

## 2014-10-15 MED ORDER — LORAZEPAM 1 MG PO TABS
0.0000 mg | ORAL_TABLET | Freq: Two times a day (BID) | ORAL | Status: DC
  Administered 2014-10-15: 1 mg via ORAL
  Filled 2014-10-15: qty 1

## 2014-10-15 MED ORDER — NICOTINE 21 MG/24HR TD PT24
21.0000 mg | MEDICATED_PATCH | Freq: Every day | TRANSDERMAL | Status: DC
  Administered 2014-10-15: 21 mg via TRANSDERMAL
  Filled 2014-10-15: qty 1

## 2014-10-15 MED ORDER — VITAMIN B-1 100 MG PO TABS
100.0000 mg | ORAL_TABLET | Freq: Every day | ORAL | Status: DC
  Administered 2014-10-16 – 2014-10-18 (×3): 100 mg via ORAL
  Filled 2014-10-15 (×5): qty 1

## 2014-10-15 MED ORDER — THIAMINE HCL 100 MG/ML IJ SOLN: 100.0000 mg | Freq: Every day | INTRAMUSCULAR | Status: DC

## 2014-10-15 MED ORDER — ADULT MULTIVITAMIN W/MINERALS CH
1.0000 | ORAL_TABLET | Freq: Every day | ORAL | Status: DC
  Administered 2014-10-16 – 2014-10-18 (×3): 1 via ORAL
  Filled 2014-10-15 (×5): qty 1

## 2014-10-15 MED ORDER — BENAZEPRIL HCL 40 MG PO TABS
40.0000 mg | ORAL_TABLET | Freq: Every day | ORAL | Status: DC
  Administered 2014-10-16 – 2014-10-18 (×3): 40 mg via ORAL
  Filled 2014-10-15: qty 1
  Filled 2014-10-15: qty 16
  Filled 2014-10-15 (×3): qty 1

## 2014-10-15 MED ORDER — HYDROXYZINE PAMOATE 25 MG PO CAPS: 25.0000 mg | ORAL_CAPSULE | Freq: Four times a day (QID) | ORAL | Status: DC

## 2014-10-15 MED ORDER — ACETAMINOPHEN 325 MG PO TABS
650.0000 mg | ORAL_TABLET | Freq: Four times a day (QID) | ORAL | Status: DC | PRN
  Administered 2014-10-16 – 2014-10-17 (×2): 650 mg via ORAL
  Filled 2014-10-15 (×2): qty 2

## 2014-10-15 MED ORDER — LORAZEPAM 1 MG PO TABS
1.0000 mg | ORAL_TABLET | Freq: Two times a day (BID) | ORAL | Status: DC
  Administered 2014-10-18: 1 mg via ORAL
  Filled 2014-10-15: qty 1

## 2014-10-15 MED ORDER — BENAZEPRIL HCL 40 MG PO TABS
40.0000 mg | ORAL_TABLET | Freq: Every day | ORAL | Status: DC
  Administered 2014-10-15: 40 mg via ORAL
  Filled 2014-10-15: qty 1

## 2014-10-15 MED ORDER — LOPERAMIDE HCL 2 MG PO CAPS: 2.0000 mg | ORAL_CAPSULE | ORAL | Status: DC | PRN

## 2014-10-15 MED ORDER — FENOFIBRATE 160 MG PO TABS
160.0000 mg | ORAL_TABLET | Freq: Every day | ORAL | Status: DC
  Administered 2014-10-16 – 2014-10-18 (×3): 160 mg via ORAL
  Filled 2014-10-15: qty 4
  Filled 2014-10-15 (×4): qty 1

## 2014-10-15 MED ORDER — TRAZODONE HCL 50 MG PO TABS: 50.0000 mg | ORAL_TABLET | Freq: Every evening | ORAL | Status: DC | PRN

## 2014-10-15 MED ORDER — NICOTINE 14 MG/24HR TD PT24
14.0000 mg | MEDICATED_PATCH | Freq: Every day | TRANSDERMAL | Status: DC
Start: 2014-10-16 — End: 2014-10-18
  Administered 2014-10-16 – 2014-10-17 (×2): 14 mg via TRANSDERMAL
  Filled 2014-10-15 (×4): qty 1

## 2014-10-15 MED ORDER — ALBUTEROL SULFATE HFA 108 (90 BASE) MCG/ACT IN AERS: 2.0000 | INHALATION_SPRAY | RESPIRATORY_TRACT | Status: DC | PRN

## 2014-10-15 MED ORDER — CITALOPRAM HYDROBROMIDE 40 MG PO TABS
40.0000 mg | ORAL_TABLET | Freq: Every day | ORAL | Status: DC
  Administered 2014-10-16 – 2014-10-18 (×3): 40 mg via ORAL
  Filled 2014-10-15 (×2): qty 1
  Filled 2014-10-15: qty 4
  Filled 2014-10-15 (×2): qty 1

## 2014-10-15 MED ORDER — TRAZODONE HCL 50 MG PO TABS
50.0000 mg | ORAL_TABLET | Freq: Every evening | ORAL | Status: DC | PRN
  Administered 2014-10-15 – 2014-10-17 (×3): 50 mg via ORAL
  Filled 2014-10-15 (×7): qty 1
  Filled 2014-10-15: qty 8
  Filled 2014-10-15 (×2): qty 1
  Filled 2014-10-15: qty 8

## 2014-10-15 MED ORDER — LORAZEPAM 1 MG PO TABS
1.0000 mg | ORAL_TABLET | Freq: Three times a day (TID) | ORAL | Status: AC
  Administered 2014-10-17 (×3): 1 mg via ORAL
  Filled 2014-10-15 (×3): qty 1

## 2014-10-15 MED ORDER — ALBUTEROL SULFATE HFA 108 (90 BASE) MCG/ACT IN AERS
2.0000 | INHALATION_SPRAY | RESPIRATORY_TRACT | Status: DC | PRN
  Administered 2014-10-15: 2 via RESPIRATORY_TRACT
  Filled 2014-10-15: qty 6.7

## 2014-10-15 MED ORDER — SIMVASTATIN 40 MG PO TABS
40.0000 mg | ORAL_TABLET | Freq: Every day | ORAL | Status: DC
  Filled 2014-10-15: qty 1

## 2014-10-15 MED ORDER — NIACIN ER 500 MG PO CPCR
1000.0000 mg | ORAL_CAPSULE | Freq: Every day | ORAL | Status: DC
  Filled 2014-10-15: qty 2

## 2014-10-15 MED ORDER — THIAMINE HCL 100 MG/ML IJ SOLN: 100.0000 mg | Freq: Once | INTRAMUSCULAR | Status: DC

## 2014-10-15 MED ORDER — ALUM & MAG HYDROXIDE-SIMETH 200-200-20 MG/5ML PO SUSP: 30.0000 mL | ORAL | Status: DC | PRN

## 2014-10-15 MED ORDER — SIMVASTATIN 40 MG PO TABS
40.0000 mg | ORAL_TABLET | Freq: Every day | ORAL | Status: DC
  Administered 2014-10-16 – 2014-10-18 (×3): 40 mg via ORAL
  Filled 2014-10-15: qty 1
  Filled 2014-10-15: qty 4
  Filled 2014-10-15 (×3): qty 1

## 2014-10-15 MED ORDER — MAGNESIUM HYDROXIDE 400 MG/5ML PO SUSP: 30.0000 mL | Freq: Every day | ORAL | Status: DC | PRN

## 2014-10-15 MED ORDER — HYDROXYZINE HCL 25 MG PO TABS: 25.0000 mg | ORAL_TABLET | Freq: Four times a day (QID) | ORAL | Status: DC | PRN

## 2014-10-15 MED ORDER — IBUPROFEN 200 MG PO TABS
600.0000 mg | ORAL_TABLET | Freq: Three times a day (TID) | ORAL | Status: DC | PRN
  Administered 2014-10-15: 600 mg via ORAL
  Filled 2014-10-15: qty 3

## 2014-10-15 MED ORDER — OMEGA-3-ACID ETHYL ESTERS 1 G PO CAPS
2.0000 g | ORAL_CAPSULE | Freq: Two times a day (BID) | ORAL | Status: DC
  Administered 2014-10-15: 2 g via ORAL
  Filled 2014-10-15 (×2): qty 2

## 2014-10-15 MED ORDER — VITAMIN B-1 100 MG PO TABS
100.0000 mg | ORAL_TABLET | Freq: Every day | ORAL | Status: DC
  Administered 2014-10-15: 100 mg via ORAL
  Filled 2014-10-15: qty 1

## 2014-10-15 MED ORDER — FENOFIBRATE 160 MG PO TABS
160.0000 mg | ORAL_TABLET | Freq: Every day | ORAL | Status: DC
  Administered 2014-10-15: 160 mg via ORAL
  Filled 2014-10-15: qty 1

## 2014-10-15 NOTE — BH Assessment (Signed)
Per Inetta Fermoina [facility].  Writer informed the nurse and the TTS Therapist Jessie Foot([place].

## 2014-10-15 NOTE — ED Notes (Signed)
Pt states that he wants detox from etoh.  Also uses marijuana.  In a normal day, pt drinks about a 1/5 of vodka.  Last drink was about an hour ago.  Denies SI/HI.

## 2014-10-15 NOTE — Progress Notes (Signed)
  CARE MANAGEMENT ED NOTE 10/15/2014  Patient:  Marvin Cooper,Marvin Cooper   Account Number:  [id number]  Date Initiated:  10/15/2014  Documentation initiated by:  Edd ArbourGIBBS,KIMBERLY  Subjective/Objective Assessment:   43 yr old bcbs ppo out of state [place] c/o need for detox from etoh.  Also uses marijuana.  In a normal day, pt drinks about a 1/5 of vodka [facility]     Subjective/Objective Assessment Detail:   TTS states pt [facility]    No pcp listed for pt     Action/Plan:   ED CM spoke with TTS staff about disposition and developing alternative plans while [facility]   Action/Plan Detail:   Anticipated DC Date:  10/16/2014     Status Recommendation to Physician:   Result of Recommendation:    Other ED Services  Consult Working Plan    DC Planning Services  Other  Outpatient Services - Pt will follow up    Choice offered to / List presented to:            Status of service:  Completed, signed off  ED Comments:   ED Comments Detail:

## 2014-10-15 NOTE — ED Provider Notes (Signed)
CSN: [id number]     Arrival date & time 10/15/14  [id number] History   First MD Initiated Contact with Patient 10/15/14 [phone]     Chief Complaint  Patient presents with  . etoh detox      (Consider location/radiation/quality/duration/timing/severity/associated sxs/prior Treatment) HPI   43yM with ETOH abuse. Presenting for detox. Last drink earlier today. Denies acute stressor or other specific precipitant in terms of presenting today. "I just gotta stop." Shakes/feels anxious when doesn't drink. Denies hx of hallucinations or etoh withdrawal seizure. Smokes marijuana. Denies other drug use. No So or HI. Does admit to feeling depressed.    Past Medical History  Diagnosis Date  . Hypertension   . Hypercholesteremia   . Alcohol abuse    Past Surgical History  Procedure Laterality Date  . Knee surgery    . Leg surgery Left Age 43    ORIF lower leg  . Adenoidectomy  Age 40  . Shoulder arthroscopy     Family History  Problem Relation Age of Onset  . Alcohol abuse Brother   . Alcohol abuse Sister   . Alcohol abuse Maternal Grandfather    History  Substance Use Topics  . Smoking status: Current Every Day Smoker -- 1.00 packs/day for 20 years    Types: Cigarettes  . Smokeless tobacco: Never Used  . Alcohol Use: 48.0 oz/week    80 Cans of beer per week    Review of Systems  All systems reviewed and negative, other than as noted in HPI.   Allergies  Review of patient's allergies indicates no known allergies.  Home Medications   Prior to Admission medications   Medication Sig Start Date End Date Taking? Authorizing Provider  albuterol (PROVENTIL HFA;VENTOLIN HFA) 108 (90 BASE) MCG/ACT inhaler Inhale 2 puffs into the lungs every 4 (four) hours as needed for wheezing or shortness of breath. 07/15/13   Sanjuana KavaAgnes I Nwoko, NP  benazepril (LOTENSIN) 40 MG tablet Take 1 tablet (40 mg total) by mouth daily. For high blood pressure control 07/15/13   Sanjuana KavaAgnes I Nwoko, NP  cetirizine (ZYRTEC) 10 MG  tablet Take 1 tablet (10 mg total) by mouth daily. For allergies 07/15/13   Sanjuana KavaAgnes I Nwoko, NP  citalopram (CELEXA) 20 MG tablet Take 1 tablet (20 mg total) by mouth daily. For depression 07/15/13   Sanjuana KavaAgnes I Nwoko, NP  Fenofibrate 150 MG CAPS Take 150 mg by mouth daily. For cholesterol control 07/15/13   Sanjuana KavaAgnes I Nwoko, NP  hydrOXYzine (VISTARIL) 25 MG capsule Take 25 mg by mouth 4 (four) times daily.    Historical Provider, MD  niacin 500 MG CR capsule Take 1,000 mg by mouth at bedtime. For low niacin 07/15/13   Sanjuana KavaAgnes I Nwoko, NP  omega-3 acid ethyl esters (LOVAZA) 1 G capsule Take 2 capsules (2 g total) by mouth 2 (two) times daily. For cholesterol control 07/15/13   Sanjuana KavaAgnes I Nwoko, NP  simvastatin (ZOCOR) 40 MG tablet Take 1 tablet (40 mg total) by mouth at bedtime. For high cholesterol control 07/15/13   Sanjuana KavaAgnes I Nwoko, NP  traZODone (DESYREL) 50 MG tablet Take 1 tablet (50 mg total) by mouth at bedtime as needed and may repeat dose one time if needed for sleep. For sleep 07/15/13   Sanjuana KavaAgnes I Nwoko, NP   BP 140/88 mmHg  Pulse 111  Temp(Src) 98.2 F (36.8 C) (Oral)  Resp 16  SpO2 94% Physical Exam  Constitutional: He is oriented to person, place, and time. He appears well-developed  and well-nourished. No distress.  HENT:  Head: Normocephalic and atraumatic.  Eyes: Conjunctivae are normal. Pupils are equal, round, and reactive to light. Right eye exhibits no discharge. Left eye exhibits no discharge.  Neck: Neck supple.  Cardiovascular: Regular rhythm and normal heart sounds.  Exam reveals no gallop and no friction rub.   No murmur heard. Mildly tachycardic  Pulmonary/Chest: Effort normal and breath sounds normal. No respiratory distress.  Abdominal: Soft. He exhibits no distension. There is no tenderness.  Musculoskeletal: He exhibits no edema or tenderness.  Neurological: He is alert and oriented to person, place, and time. No cranial nerve deficit. He exhibits normal muscle tone. Coordination normal.   Skin: Skin is warm and dry.  Psychiatric: He has a normal mood and affect. His behavior is normal. Thought content normal.  Nursing note and vitals reviewed.   ED Course  Procedures (including critical care time) Labs Review Labs Reviewed  CBC  COMPREHENSIVE METABOLIC PANEL  ETHANOL  SALICYLATE LEVEL  URINE RAPID DRUG SCREEN (HOSP PERFORMED)    Imaging Review No results found.   EKG Interpretation None      MDM   Final diagnoses:  Alcohol abuse    43yM with etoh abuse. Seen a couple weeks ago in ED and then subsequently decided he did want help. He insists he is ready now. Does not appear to be frequent used/abuser of system. [facility]. Denies SI/HI. Does appear to be psychotic. Mild tachycardia. No significant tremor. Calm and cooperative on my examination. Will medically screen. TTS eval.     Raeford RazorStephen Geraldene Eisel, MD 10/15/14 [phone]

## 2014-10-15 NOTE — ED Notes (Signed)
Pts mothers number: [phone]. Virl Sonarole Goldschmidt.

## 2014-10-15 NOTE — Progress Notes (Signed)
Vol admit, 43 yo caucasian male, admitted requesting detox from alcohol.  Pt reports he drinks a pint of vodka and a case of beer daily or he might just drink a 1/5 of vodka daily.  He denies SI/HI/AV at this time.  He reports that he is married with a 515 yo daughter.  He says his wife is supportive.  He has a job and he does not want his drinking to jeopardize his job any more than it has already.  He says he has been drinking since the age of 43 and has used THC occasionally since he was 89.  He reports a medical hx of HTN and hypercholesteremia.  Pt was hypertensive on admission and the PA was informed.  Pt states he recently had R rotator cuff surgery 8 weeks ago and has been drinking to self medicate.  Pt was pleasant/cooperative with the admission process.  Search completed and paperwork signed.  Pt declined a meal.  Pt was oriented to unit/room.  Safety checks q15 minutes were initiated.

## 2014-10-15 NOTE — BH Assessment (Signed)
Per Burnett HarryShelly (NP) patient meets criteria for inpatient hospitalization.  [facility]  Tom (TTS) will obtain support paperwork.

## 2014-10-15 NOTE — ED Notes (Signed)
Patient states he is here for alcohol detox.  He states [facility] and [facility]  Patient states he was at work yesterday and started having withdrawal symptoms and had to leave and go to the liquor store and buy vodka.  He drinks approximately a 1/5 per day.  He is also positive for THC.  Patient does not like attending AA meetings and stays home, isolates, watches TV and drinks.  He states he has to drink to function.  He denies any SI/HI/AVH.  [facility].

## 2014-10-15 NOTE — ED Notes (Signed)
Patient denies SI, HI, AVH at present. Patient is calm, cooperative. C/o shoulder pain from recent surgery. No acute distress noted. [facility]

## 2014-10-15 NOTE — BH Assessment (Addendum)
Tele Assessment Note   Marvin Cooper is a 43 y.o. male requesting detox from alcohol.  Patient denies SI/HI/Psychosis.  Patient reports that he drinks a fifth of vodka daily.  Patient reports that he has been addicted to alcohol for 20 years.  Patient reports his first drink at the age of 43.  Patient denies withdrawal symptoms.  Patient denies a history of seizures.    Patient [facility] and [facility].    Patient reports that he has to drink to function.  Patient denies physical, sexual and emotional abuse.  Patient denies prior psychiatric hospitalization.    Axis I: Alcohol Abuse, Severe Axis II: Deferred Axis III:  Past Medical History  Diagnosis Date  . Hypertension   . Hypercholesteremia   . Alcohol abuse    Axis IV: other psychosocial or environmental problems, problems related to social environment and problems with primary support group Axis V: 31-40 impairment in reality testing  Past Medical History:  Past Medical History  Diagnosis Date  . Hypertension   . Hypercholesteremia   . Alcohol abuse     Past Surgical History  Procedure Laterality Date  . Knee surgery    . Leg surgery Left Age [age]    ORIF lower leg  . Adenoidectomy  Age 84  . Shoulder arthroscopy      Family History:  Family History  Problem Relation Age of Onset  . Alcohol abuse Brother   . Alcohol abuse Sister   . Alcohol abuse Maternal Grandfather     Social History:  reports that he has been smoking Cigarettes.  He has a 20 pack-year smoking history. He has never used smokeless tobacco. He reports that he drinks about 48.0 oz of alcohol per week. He reports that he uses illicit drugs (Marijuana).  Additional Social History:     CIWA: CIWA-Ar BP: 140/88 mmHg Pulse Rate: 111 COWS:    PATIENT STRENGTHS: (choose at least two) Ability for insight Active sense of humor Average or above average intelligence Capable of independent living Leisure centre managerCommunication  skills Financial means Motivation for treatment/growth  Allergies: No Known Allergies  Home Medications:  (Not in a hospital admission)  OB/GYN Status:  No LMP for male patient.  General Assessment Data Location of Assessment: [facility] Assessment Services Is this a Tele or Face-to-Face Assessment?: Tele Assessment Is this an Initial Assessment or a Re-assessment for this encounter?: Initial Assessment Living Arrangements: Spouse/significant other, Children Can pt return to current living arrangement?: Yes Admission Status: Voluntary Is patient capable of signing voluntary admission?: Yes Transfer from: Home Referral Source: Self/Family/Friend  Medical Screening Exam [facility] Walk-in ONLY) Medical Exam completed: Yes  [facility] Living Arrangements: Spouse/significant other, Children Name of Psychiatrist: none Name of Therapist: none  Education Status Is patient currently in school?: No Current Grade: NA Highest grade of school patient has completed: NA Name of school: NA Contact person: NA  Risk to self with the past 6 months Suicidal Ideation: No Suicidal Intent: No Is patient at risk for suicide?: No Suicidal Plan?: No Access to Means: No What has been your use of drugs/alcohol within the last 12 months?: Alcohol Previous Attempts/Gestures: No How many times?: 0 Other Self Harm Risks: None Reporte Triggers for Past Attempts: None known Intentional Self Injurious Behavior: None Family Suicide History: Yes Recent stressful life event(s):  (Cannot stop drinking) Persecutory voices/beliefs?: No Depression: Yes Depression Symptoms: Feeling worthless/self pity, Feeling angry/irritable Substance abuse history and/or treatment for substance abuse?:  Yes Suicide prevention information given to non-admitted patients: Not applicable  Risk to Others within the past 6 months Homicidal Ideation: No Thoughts of Harm to Others: No Current Homicidal Intent: No Current  Homicidal Plan: No Access to Homicidal Means: No Identified Victim: NA History of harm to others?: No Assessment of Violence: None Noted Violent Behavior Description: NA Does patient have access to weapons?: Yes (Comment) Criminal Charges Pending?: No Does patient have a court date: No  Psychosis Hallucinations: None noted Delusions: None noted  Mental Status Report Appear/Hygiene: Disheveled Eye Contact: Fair Motor Activity: Freedom of movement Speech: Logical/coherent Level of Consciousness: Alert Mood: Depressed Affect: Depressed Anxiety Level: None Thought Processes: Coherent, Relevant Judgement: Unimpaired Orientation: Person, Place, Time, Situation Obsessive Compulsive Thoughts/Behaviors: None  Cognitive Functioning Concentration: Normal Memory: Recent Intact, Remote Intact IQ: Average Insight: Fair Impulse Control: Poor Appetite: Fair Weight Loss: 0 Weight Gain: 0 Sleep: No Change Total Hours of Sleep: 7 Vegetative Symptoms: None  ADLScreening [facility]) Patient's cognitive ability adequate to safely complete daily activities?: Yes Patient able to express need for assistance with ADLs?: Yes Independently performs ADLs?: Yes (appropriate for developmental age)  Prior Inpatient Therapy Prior Inpatient Therapy: Yes Prior Therapy Dates: 2015 [name] Reason for Treatment: SA  Prior Outpatient Therapy Prior Outpatient Therapy: Yes Prior Therapy Dates: 2014 [facility] Reason for Treatment: SA  ADL Screening (condition at time of admission) Patient's cognitive ability adequate to safely complete daily activities?: Yes Patient able to express need for assistance with ADLs?: Yes Independently performs ADLs?: Yes (appropriate for developmental age)                  Additional Information 1:1 In Past 12 Months?: No CIRT Risk:  No Elopement Risk: No Does patient have medical clearance?: Yes     Disposition: Pending psych disposition.  Disposition Initial Assessment Completed for this Encounter: Yes Disposition of Patient: Other dispositions (Pending psych dispo)  Phillip HealStevenson, Elsia Lasota LaVerne 10/15/2014 10:20 AM

## 2014-10-15 NOTE — Tx Team (Signed)
Initial Interdisciplinary Treatment Plan   PATIENT STRESSORS: Health problems Occupational concerns Substance abuse   PATIENT STRENGTHS: Ability for insight Average or above average intelligence Capable of independent living Communication skills General fund of knowledge Motivation for treatment/growth Supportive family/friends Work skills   PROBLEM LIST: Problem List/Patient Goals Date to be addressed Date deferred Reason deferred Estimated date of resolution  Substance abuse-alcohol and using THC      Pain associated with recent R rotator cuff surgery-"I drink to deal with the pain."      "I just can't seem to stop drinking; I have a good family and a job, so I need to get sober."      "I want to get sober and stay sober"                                     DISCHARGE CRITERIA:  Ability to meet basic life and health needs Improved stabilization in mood, thinking, and/or behavior Motivation to continue treatment in a less acute level of care Verbal commitment to aftercare and medication compliance Withdrawal symptoms are absent or subacute and managed without 24-hour nursing intervention  PRELIMINARY DISCHARGE PLAN: Attend aftercare/continuing care group Attend 12-step recovery group Return to previous living arrangement Return to previous work or school arrangements  PATIENT/FAMIILY INVOLVEMENT: This treatment plan has been presented to and reviewed with the patient, Marvin Cooper, and/or family member.  The patient and family have been given the opportunity to ask questions and make suggestions.  Jesus GeneraSpeagle, Ayona Yniguez [facility] 10/15/2014, 11:34 PM

## 2014-10-15 NOTE — BH Assessment (Signed)
Writer received collateral clinical information regarding the patient from Dr. Juleen ChinaKohut.  Writer was informed by the nurse that they will place the machine in the patients room.

## 2014-10-16 ENCOUNTER — Encounter (HOSPITAL_COMMUNITY): Payer: Self-pay | Admitting: Psychiatry

## 2014-10-16 DIAGNOSIS — F333 Major depressive disorder, recurrent, severe with psychotic symptoms: Secondary | ICD-10-CM | POA: Diagnosis present

## 2014-10-16 DIAGNOSIS — F1020 Alcohol dependence, uncomplicated: Secondary | ICD-10-CM | POA: Diagnosis present

## 2014-10-16 DIAGNOSIS — F339 Major depressive disorder, recurrent, unspecified: Secondary | ICD-10-CM | POA: Diagnosis present

## 2014-10-16 DIAGNOSIS — F1994 Other psychoactive substance use, unspecified with psychoactive substance-induced mood disorder: Secondary | ICD-10-CM

## 2014-10-16 MED ORDER — BUPROPION HCL ER (XL) 150 MG PO TB24
150.0000 mg | ORAL_TABLET | Freq: Every day | ORAL | Status: DC
  Administered 2014-10-17 – 2014-10-18 (×2): 150 mg via ORAL
  Filled 2014-10-16 (×2): qty 1
  Filled 2014-10-16: qty 4
  Filled 2014-10-16: qty 1

## 2014-10-16 NOTE — [facility] Suicide Risk Assessment (Signed)
Suicide Risk Assessment  Admission Assessment     Nursing information obtained from:  Patient, Review of record Demographic factors:  Male, Caucasian Current Mental Status:  NA Loss Factors:  NA Historical Factors:  NA Risk Reduction Factors:  Responsible for children under 43 years of age, Sense of responsibility to family, Employed, Living with another person, especially a relative, Positive social support Total Time spent with patient: 45 minutes  COGNITIVE FEATURES THAT CONTRIBUTE TO RISK:  Closed-mindedness Polarized thinking Thought constriction (tunnel vision)    SUICIDE RISK:   Mild:   PLAN OF CARE: Supportive approach/coping skills/relapse prevention                               Ativan Detox/reassess and address the co morbidities                               Optimize response to psychotropics  I certify that inpatient services furnished can reasonably be expected to improve the patient's condition.  Zsofia Prout A 10/16/2014, 3:20 PM

## 2014-10-16 NOTE — Progress Notes (Signed)
Pt did not attend NA group this evening.  

## 2014-10-16 NOTE — Progress Notes (Signed)
Pt stated that today was a good day. Minimum withdrawal symptoms.

## 2014-10-16 NOTE — H&P (Signed)
Psychiatric Admission Assessment Adult  Patient Identification:  Marvin Cooper Date of Evaluation:  10/16/2014 Chief Complaint:  ALCOHOL ABUSE,SEVERE History of Present Illness:: 43 Y/O male who was in this unit in August 2014. After D/C he pursued outpatient treatment. He states he did well for a while but then relapsed. Went to [facility] in the spring of this year. States he did well for four months. Come Thanksgiving he was off for a week and he started drinking. States he is drinking a lot of Marvin Cooper, and Beer until passes out. Tried to detox but could not. States he has realized that there is an underlying depression that has been there even when he has abstained from drinking. He has been on Celexa for few years but it does not seem to be taking care of it. He has gone off the Celexa up for a week but he experiences severe withdrawal and has to go back on it. He is in couples counseling and feels the relationship had been getting better until this last relapse.    Associated Signs/Synptoms: Depression Symptoms:  depressed mood, anhedonia, fatigue, difficulty concentrating, anxiety, panic attacks, loss of energy/fatigue, disturbed sleep, decreased appetite, (Hypo) Manic Symptoms:  Irritable Mood, Labiality of Mood, Anxiety Symptoms:  Excessive Worry, Panic Symptoms, Psychotic Symptoms:  Denies PTSD Symptoms: NA Total Time spent with patient: 45 minutes  Psychiatric Specialty Exam: Physical Exam  Review of Systems  Constitutional: Positive for malaise/fatigue and diaphoresis.  HENT: Negative.   Eyes: Negative.   Respiratory: Negative.   Cardiovascular: Negative.   Gastrointestinal: Positive for nausea and diarrhea.  Genitourinary: Negative.   Musculoskeletal: Negative.   Skin: Negative.   Neurological: Positive for tremors and weakness.  Endo/Heme/Allergies: Negative.   Psychiatric/Behavioral: Positive for depression and substance abuse. The patient is nervous/anxious.      Blood pressure 140/95, pulse 117, temperature 98.7 F (37.1 C), temperature source Oral, resp. rate 16, height '5\' 7"'  (1.702 m), weight 95.255 kg (210 lb), SpO2 97 %.Body mass index is 32.88 kg/(m^2).  General Appearance: Fairly Groomed  Engineer, water::  Fair  Speech:  Clear and Coherent  Volume:  Normal  Mood:  Anxious and Depressed  Affect:  sad, worried  Thought Process:  Coherent and Goal Directed  Orientation:  Full (Time, Place, and Person)  Thought Content:  events symptoms worries concerns  Suicidal Thoughts:  No  Homicidal Thoughts:  No  Memory:  Immediate;   Fair Recent;   Fair Remote;   Fair  Judgement:  Fair  Insight:  Present  Psychomotor Activity:  Normal  Concentration:  Fair  Recall:  [employer] of Knowledge:NA  Language: Fair  Akathisia:  No  Handed:    AIMS (if indicated):     Assets:  Desire for Improvement Housing Transportation Vocational/Educational  Sleep:  Number of Hours: 6.75    Musculoskeletal: Strength & Muscle Tone: within normal limits Gait & Station: normal Patient leans: N/A  Past Psychiatric History: Diagnosis:  [facility]ospitalizations: [facility]   Outpatient Care:  [facility]stance Abuse [facility]  Self-Mutilation:Denies  Suicidal Attempts:Denies  Violent Behaviors:Denies   Past Medical History:   Past Medical History  Diagnosis Date  . Hypertension   . Hypercholesteremia   . Alcohol abuse   . Asthma     As above Allergies:  No Known Allergies PTA Medications: Prescriptions prior to admission  Medication Sig Dispense Refill Last Dose  . albuterol (PROVENTIL HFA;VENTOLIN HFA) 108 (90 BASE) MCG/ACT inhaler Inhale  2 puffs into the lungs every 4 (four) hours as needed for wheezing or shortness of breath.   10/15/2014 at Unknown time  . benazepril (LOTENSIN) 40 MG tablet Take 1 tablet (40 mg total) by mouth daily. For high blood pressure control   10/15/2014 at Unknown time  . Fenofibrate 150 MG CAPS Take 150 mg by  mouth daily. For cholesterol control   10/14/2014 at Unknown time  . loratadine (CLARITIN) 10 MG tablet Take 10 mg by mouth daily.   10/14/2014 at Unknown time  . niacin 500 MG CR capsule Take 1,000 mg by mouth at bedtime. For low niacin   10/14/2014 at Unknown time  . omega-3 acid ethyl esters (LOVAZA) 1 G capsule Take 2 capsules (2 g total) by mouth 2 (two) times daily. For cholesterol control   10/14/2014 at Unknown time  . oxyCODONE-acetaminophen (PERCOCET/ROXICET) 5-325 MG per tablet Take by mouth every 6 (six) hours as needed for severe pain.     . simvastatin (ZOCOR) 40 MG tablet Take 1 tablet (40 mg total) by mouth at bedtime. For high cholesterol control (Patient taking differently: Take 40 mg by mouth daily. For high cholesterol control) 30 tablet  10/15/2014 at Unknown time  . citalopram (CELEXA) 40 MG tablet Take 40 mg by mouth daily.   Unknown at Unknown time    Previous Psychotropic Medications:  Medication/Dose    Celexa 40 mg daily             Substance Abuse History in the last 12 months:  Yes.    Consequences of Substance Abuse: Blackouts:   Withdrawal Symptoms:   Diaphoresis Diarrhea Headaches Nausea Tremors Vomiting  Social History:  reports that he has been smoking Cigarettes.  He has a 5 pack-year smoking history. He has never used smokeless tobacco. He reports that he drinks about 48.0 oz of alcohol per week. He reports that he uses illicit drugs (Marijuana). Additional Social History: Pain Medications: See home med list Prescriptions: See home med list Over the Counter: See home med list History of alcohol / drug use?: Yes Negative Consequences of Use: Personal relationships, Work / School Name of Substance 1: ETOH 1 - Age of First Use: 12 1 - Amount (size/oz): pint of vodka & 12 pk beer or 1/5 of vodka 1 - Frequency: daily 1 - Duration: ongoing 1 - Last Use / Amount: 10/15/14 Name of Substance 2: THC 2 - Age of First Use: 13 2 - Amount (size/oz):  varies 2 - Frequency: occasionally 2 - Duration: ongoing 2 - Last Use / Amount: 10/14/14                Current Place of Residence:  Lives with his wife Place of Birth:   Family Members: Marital Status:  Married Children:  Sons:  Daughters: 5 Relationships: Education:  Advice worker, 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 Legal History:1997 a DWI Hobbies/Interests:  Family History:   Family History  Problem Relation Age of Onset  . Alcohol abuse Brother   . Alcohol abuse Sister   . Alcohol abuse Maternal Grandfather   Also depression in the family  Results for orders placed or performed during the hospital encounter of 10/15/14 (from the past 72 hour(s))  Urine Drug Screen     Status: Abnormal   Collection Time: 10/15/14  9:19 AM  Result Value Ref  Range   Opiates NONE DETECTED NONE DETECTED   Cocaine NONE DETECTED NONE DETECTED   Benzodiazepines NONE DETECTED NONE DETECTED   Amphetamines NONE DETECTED NONE DETECTED   Tetrahydrocannabinol POSITIVE (A) NONE DETECTED   Barbiturates NONE DETECTED NONE DETECTED    Comment:        DRUG SCREEN FOR MEDICAL PURPOSES ONLY.  IF CONFIRMATION IS NEEDED FOR ANY PURPOSE, NOTIFY LAB WITHIN 5 DAYS.        LOWEST DETECTABLE LIMITS FOR URINE DRUG SCREEN Drug Class       Cutoff (ng/mL) Amphetamine      1000 Barbiturate      200 Benzodiazepine   998 Tricyclics       338 Opiates          300 Cocaine          300 THC              50   CBC     Status: None   Collection Time: 10/15/14  9:34 AM  Result Value Ref Range   WBC 7.9 4.0 - 10.5 K/uL   RBC 5.00 4.22 - 5.81 MIL/uL   Hemoglobin 15.6 13.0 - 17.0 g/dL   HCT 46.7 39.0 - 52.0 %   MCV 93.4 78.0 - 100.0 fL   MCH 31.2 26.0 - 34.0 pg   MCHC 33.4 30.0 - 36.0 g/dL    RDW 14.6 11.5 - 15.5 %   Platelets 265 150 - 400 K/uL  Comprehensive metabolic panel     Status: Abnormal   Collection Time: 10/15/14  9:34 AM  Result Value Ref Range   Sodium 144 137 - 147 mEq/L   Potassium 4.2 3.7 - 5.3 mEq/L   Chloride 104 96 - 112 mEq/L   CO2 22 19 - 32 mEq/L   Glucose, Bld 107 (H) 70 - 99 mg/dL   BUN 10 6 - 23 mg/dL   Creatinine, Ser 0.73 0.50 - 1.35 mg/dL   Calcium 9.0 8.4 - 10.5 mg/dL   Total Protein 7.7 6.0 - 8.3 g/dL   Albumin 4.2 3.5 - 5.2 g/dL   AST 50 (H) 0 - 37 U/L   ALT 35 0 - 53 U/L   Alkaline Phosphatase 79 39 - 117 U/L   Total Bilirubin <0.2 (L) 0.3 - 1.2 mg/dL   GFR calc non Af Amer >90 >90 mL/min   GFR calc Af Amer >90 >90 mL/min    Comment: (NOTE) The eGFR has been calculated using the CKD EPI equation. This calculation has not been validated in all clinical situations. eGFR's persistently <90 mL/min signify possible Chronic Kidney Disease.    Anion gap 18 (H) 5 - 15  Ethanol (ETOH)     Status: Abnormal   Collection Time: 10/15/14  9:34 AM  Result Value Ref Range   Alcohol, Ethyl (B) 337 (H) 0 - 11 mg/dL    Comment:        LOWEST DETECTABLE LIMIT FOR SERUM ALCOHOL IS 11 mg/dL FOR MEDICAL PURPOSES ONLY   Salicylate level     Status: Abnormal   Collection Time: 10/15/14  9:34 AM  Result Value Ref Range   Salicylate Lvl <2.5 (L) 2.8 - 20.0 mg/dL   Psychological Evaluations:  Assessment:   DSM5:  Substance/Addictive Disorders:  Alcohol Related Disorder - Severe (303.90) Depressive Disorders:  Major Depressive Disorder - Moderate (296.22)  AXIS I:  Substance Induced Mood Disorder AXIS II:  No diagnosis AXIS III:   Past Medical History  Diagnosis Date  . Hypertension   . Hypercholesteremia   . Alcohol abuse   . Asthma    AXIS IV:  other psychosocial or environmental problems AXIS V:  41-50 serious symptoms  Treatment Plan/Recommendations:  Supportive approach/coping skills/relapse prevention                                                                  Ativan Detox protocol                                                                 Augment the Celexa with Wellbutrin XL 150 mg in AM  Treatment Plan Summary: Daily contact with patient to assess and evaluate symptoms and progress in treatment Medication management Current Medications:  Current Facility-Administered Medications  Medication Dose Route Frequency Provider Last Rate Last Dose  . acetaminophen (TYLENOL) tablet 650 mg  650 mg Oral Q6H PRN Laverle Hobby, PA-C   650 mg at 10/16/14 1610  . albuterol (PROVENTIL HFA;VENTOLIN HFA) 108 (90 BASE) MCG/ACT inhaler 2 puff  2 puff Inhalation Q4H PRN Laverle Hobby, PA-C      . alum & mag hydroxide-simeth (MAALOX/MYLANTA) 200-200-20 MG/5ML suspension 30 mL  30 mL Oral Q4H PRN Laverle Hobby, PA-C      . benazepril (LOTENSIN) tablet 40 mg  40 mg Oral Daily Laverle Hobby, PA-C   40 mg at 10/16/14 0827  . citalopram (CELEXA) tablet 40 mg  40 mg Oral Daily Laverle Hobby, PA-C   40 mg at 10/16/14 0827  . fenofibrate tablet 160 mg  160 mg Oral Daily Laverle Hobby, PA-C   160 mg at 10/16/14 9604  . hydrOXYzine (ATARAX/VISTARIL) tablet 25 mg  25 mg Oral Q6H PRN Laverle Hobby, PA-C      . loperamide (IMODIUM) capsule 2-4 mg  2-4 mg Oral PRN Laverle Hobby, PA-C      . loratadine (CLARITIN) tablet 10 mg  10 mg Oral Daily Laverle Hobby, PA-C   10 mg at 10/16/14 0827  . LORazepam (ATIVAN) tablet 1 mg  1 mg Oral Q6H PRN Laverle Hobby, PA-C      . LORazepam (ATIVAN) tablet 1 mg  1 mg Oral QID Laverle Hobby, PA-C   1 mg at 10/16/14 5409   Followed by  . [START ON 10/17/2014] LORazepam (ATIVAN) tablet 1 mg  1 mg Oral TID Laverle Hobby, PA-C       Followed by  . [START ON 10/18/2014] LORazepam (ATIVAN) tablet 1 mg  1 mg Oral BID Laverle Hobby, PA-C       Followed by  . [START ON 10/19/2014] LORazepam (ATIVAN) tablet 1 mg  1 mg Oral Daily Spencer E Simon, PA-C      . magnesium hydroxide (MILK OF MAGNESIA)  suspension 30 mL  30 mL Oral Daily PRN Laverle Hobby, PA-C      . multivitamin with minerals tablet 1 tablet  1 tablet Oral Daily Laverle Hobby, PA-C   1 tablet  at 10/16/14 0827  . nicotine (NICODERM CQ - dosed in mg/24 hours) patch 14 mg  14 mg Transdermal Daily Laverle Hobby, PA-C   14 mg at 10/16/14 1828  . ondansetron (ZOFRAN-ODT) disintegrating tablet 4 mg  4 mg Oral Q6H PRN Laverle Hobby, PA-C   4 mg at 10/16/14 8337  . simvastatin (ZOCOR) tablet 40 mg  40 mg Oral Daily Laverle Hobby, PA-C   40 mg at 10/16/14 0827  . thiamine (VITAMIN B-1) tablet 100 mg  100 mg Oral Daily Laverle Hobby, PA-C   100 mg at 10/16/14 0827  . traZODone (DESYREL) tablet 50 mg  50 mg Oral QHS,MR X 1 Laverle Hobby, PA-C   50 mg at 10/15/14 2148    Observation Level/Precautions:  15 minute checks  Laboratory:  As per the ED  Psychotherapy:  Individual/group  Medications:  Ativan Detox/add Wellbutrin   Consultations:    Discharge Concerns:    Estimated LOS:3-5 days  Other:     I certify that inpatient services furnished can reasonably be expected to improve the patient's condition.   Nyashia Raney A 12/3/201511:14 AM

## 2014-10-16 NOTE — Progress Notes (Signed)
Recreation Therapy Notes  Animal-Assisted Activity/Therapy (AAA/T) Program Checklist/Progress Notes Patient Eligibility Criteria Checklist & Daily Group note for Rec Tx Intervention  Date: 12.03.2015 Time: 2:45pm Location: [address] Day[facility]    AAA/T Program Assumption of Risk Form signed by Patient/ or Parent Legal Guardian yes  Patient is free of allergies or sever asthma yes  Patient reports no fear of animals yes  Patient reports no history of cruelty to animals yes  Patient understands his/her participation is voluntary yes  Patient washes hands before animal contact yes  Patient washes hands after animal contact yes  Behavioral Response: Appropriate   Education: Hand Washing, Appropriate Animal Interaction   Education Outcome: Acknowledges education.   Clinical Observations/Feedback: Patient engaged with therapy dog petting him appropriately and asked appropriate questions about therapy dog and his training. Patient fed therapy dog treats in exchange for performing basic obedience skills.   Marykay Lexenise L Zakhai Meisinger, LRT/CTRS  Jearl KlinefelterBlanchfield, Whitley Patchen L 10/16/2014 4:37 PM

## 2014-10-16 NOTE — B[facility] Counselor (Signed)
Adult Comprehensive Assessment  Patient ID: Marvin Cooper, male   DOB: 24-Jul-1971, 43 y.o.   MRN: [id number]  Information Source: Information source: Patient  Current Stressors:  Educational / Learning stressors: None Employment / Job issues: Feels he is overworked Family Relationships: Problems with wife due to his history of alchohol abuse Surveyor, quantityinancial / Lack of resources (include bankruptcy): None Housing / Lack of housing: none Physical health (include injuries & life threatening diseases): None Social relationships: None  Substance abuse: Endorses alcohol abuse Bereavement / Loss: None  Living/Environment/Situation:  Living Arrangements: Spouse/significant other, Children Living conditions (as described by patient or guardian): Good How long has patient lived in current situation?: Six years What is atmosphere in current home: Comfortable, Supportive, Loving  Family History:  Marital status: Married Number of Years Married: 11 What types of issues is patient dealing with in the relationship?: Wife is upset about his alcohol abuse Additional relationship information: N/A Does patient have children?: Yes How many children?: 2 How is patient's relationship with their children?: Good relationship with 31five year old and 43 years old children  Childhood History:  By whom was/is the patient raised?: Mother/father and step-parent Additional childhood history information: Good childhood Description of patient's relationship with caregiver when they were a child: Good Patient's description of current relationship with people who raised him/her: Parents are deceased Does patient have siblings?: Yes Number of Siblings: 2 Description of patient's current relationship with siblings: Good relationship with siblings Did patient suffer any verbal/emotional/physical/sexual abuse as a child?: No Did patient suffer from severe childhood neglect?: No Has patient ever been sexually  abused/assaulted/raped as an adolescent or adult?: No Was the patient ever a victim of a crime or a disaster?: No Witnessed domestic violence?: No Has patient been effected by domestic violence as an adult?: No  Education:  Highest grade of school patient has completed: High School  Employment/Work Situation:   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 How long has patient been employed?: ten years Patient's job has been impacted by current illness: No What is the longest time patient has a held a job?: Ten years 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  He reports a few months of sobriety prior to relapsing If attempted suicide, did drugs/alcohol play a role in this?: No Alcohol/Substance Abuse Treatment Hx: Past Tx, Inpatient If yes, describe treatment[name] Has alcohol/substance abuse ever caused legal problems?: No  Social Support System:   Conservation officer, natureatient's Community Support System: Fair Museum/gallery exhibitions officerDescribe Community Support System: AA Type of faith/religion: None How does patient's faith help to cope with current illness?: N/A  Leisure/Recreation:   Leisure and Hobbies: Loves to do any out of doors activityes  Strengths/Needs:   What things does the patient do well?: Good work history - proud of home ownership In what areas does patient struggle / problems for patient: Mood swings  Discharge Plan:   Will patient be returning to same living situation after discharge?: Yes Currently receiving community mental health services:  (Patient sees Hurley CiscoBarbara Fousek  for marital counseling) If no, would  patient like referral for services when discharged?: Yes (What county?) [place]([facility] - [place]) Does patient have financial barriers related to discharge medications?: No  Summary/Recommendations:  Haynes BastJamie Jon is a 43 years old Caucasian male admitted with Alcohol Abuse Disorder.  He will benefit from crisis stabilization, evaluation for medication, psycho-education groups for coping skills development, group therapy and case management for discharge planning.     Yulianna Folse, Joesph JulyQuylle Hairston. 10/16/2014

## 2014-10-16 NOTE — [facility] Group Notes (Signed)
0900 nursing orientation group  The focus of this group is to educate the patient on the purpose and policies of crisis stabilization and provide a format to answer questions about their admission.  The group details unit policies and expectations of patients while admitted.  Pt did not attend he was in his bed asleep and refused to come.  

## 2014-10-16 NOTE — [facility] Group Notes (Signed)
[facility] LCSW Group Therapy  [facility] 1:15 - 2:30 PM  10/16/2014   Type of Therapy:  Group Therapy  Participation Level: Active  Participation Quality:  Attentive  Affect:  Appropriate  Cognitive:  Appropriate  Insight:  Developing/Improving   Engagement in Therapy:  Developing/Improving   Modes of Intervention:  Discussion, Education, Exploration, Problem-Solving, Rapport Building, Support   Summary of Progress/Problems:   Patient was attentive to speaker from the [facility] as he shared his story of dealing with mental health/substance abuse issues and overcoming it by working a recovery program.  Patient expressed interest in their programs and services and received information on their agency.  He shared he had taken part in the program in the past and plans to reconnect with their services.  Wynn BankerHodnett, Breven Guidroz Hairston 10/16/2014

## 2014-10-16 NOTE — Progress Notes (Signed)
Patient ID: Marvin Cooper, male   DOB: [birth year], 43 y.o.   MRN: [id number] D: Client report "feeling better" "tremors not as bad" anxiety "3" of 10, anxiety decreased "my stomach better, I ate today" Client reports goal met today A: Writer introduced self to client, provided emotional support, encouraged client to voice any concerns. Staff will monitor q23mn for safety. R: Client is safe on the unit, attended group.

## 2014-10-16 NOTE — Progress Notes (Signed)
Pt has been up and active in the milieu today. He rated his depression 5 anxiety 8 and denied any hopelessness on his self-inventory.  He is detoxing from alcohol his ciwa was 4 at noon today.  He has c/o tremors, cravings, and some irritability.  He denied any other physical complaints except for right shoulder pain from a torn rotator cuff tylenol at 0842 which he reported relief.  His goal today "feel better" by "taking medications and resting between groups".  He wanted to talk with the doctor about possible diagnosis of maybe ADHD.  He plans to go to AA meeting and follow-up here since his appointment with Dr. Evelene CroonKaur is sometime in April.  He did ask if he could continue to be seen here and not go to Dr. Evelene CroonKaur.  He plans to talk with Dr. Dub MikesLugo and the case manager.

## 2014-10-17 MED ORDER — OXYCODONE-ACETAMINOPHEN 5-325 MG PO TABS
1.0000 | ORAL_TABLET | Freq: Four times a day (QID) | ORAL | Status: DC | PRN
  Administered 2014-10-17 – 2014-10-18 (×3): 1 via ORAL

## 2014-10-17 MED ORDER — ACAMPROSATE CALCIUM 333 MG PO TBEC
666.0000 mg | DELAYED_RELEASE_TABLET | Freq: Three times a day (TID) | ORAL | Status: DC
  Administered 2014-10-17 – 2014-10-18 (×4): 666 mg via ORAL
  Filled 2014-10-17: qty 24
  Filled 2014-10-17: qty 2
  Filled 2014-10-17: qty 24
  Filled 2014-10-17: qty 2
  Filled 2014-10-17: qty 24
  Filled 2014-10-17 (×4): qty 2

## 2014-10-17 MED ORDER — OXYCODONE-ACETAMINOPHEN 5-325 MG PO TABS
ORAL_TABLET | ORAL | Status: AC
  Filled 2014-10-17: qty 1

## 2014-10-17 MED ORDER — ACAMPROSATE CALCIUM 333 MG PO TBEC
DELAYED_RELEASE_TABLET | ORAL | Status: AC
  Filled 2014-10-17: qty 2

## 2014-10-17 NOTE — [facility] Suicide Risk Assessment (Signed)
[facility] INPATIENT:  Family/Significant Other Suicide Prevention Education  Suicide Prevention Education:  Patient Refusal for Family/Significant Other Suicide Prevention Education: The patient Marvin Cooper has refused to provide written consent for family/significant other to be provided Family/Significant Other Suicide Prevention Education during admission and/or prior to discharge.  Physician notified.  Patient was not endorsing SI at the time of admission or during hospitalization.  Wynn BankerHodnett, Emilyanne Mcgough Hairston 10/17/2014, 12:29 PM

## 2014-10-17 NOTE — Tx Team (Signed)
Interdisciplinary Treatment Plan Update   Date Reviewed:  10/17/2014  Time Reviewed:  12:07 PM  Progress in Treatment:   Attending groups: Yes Participating in groups: Yes Taking medication as prescribed: Yes  Tolerating medication: Yes Family/Significant other contact made:  No, message left on voicemail for wife. Patient understands diagnosis: Yes, patient understands diagnosis and need for treatment. Discussing patient identified problems/goals with staff: Yes, patient is able to express goals for treatment and discharge. Medical problems stabilized or resolved: Yes Denies suicidal/homicidal ideation: Yes Patient has not harmed self or others: Yes  For review of initial/current patient goals, please see plan of care.  Estimated Length of Stay:  1-2 days  Reasons for Continued Hospitalization:  Anxiety Depression  New Problems/Goals identified:    Discharge Plan or Barriers:   [facility]  Additional Comments:  Continue medication stabilization  Patient and CSW reviewed patient's identified goals and treatment plan.  Patient verbalized understanding and agreed to treatment plan.   Attendees:  Patient:  10/17/2014 12:07 PM   Signature:  Sallyanne HaversF. Cobos, MD 10/17/2014 12:07 PM  Signature: Geoffery LyonsIrving Lugo, MD 10/17/2014 12:07 PM  Signature:  Michaelle BirksBritney Guthrie, RN 10/17/2014 12:07 PM  Signature: 10/17/2014 12:07 PM  Signature:   10/17/2014 12:07 PM  Signature:  Juline PatchQuylle Jomarion Mish, LCSW 10/17/2014 12:07 PM  Signature:  Belenda CruiseKristin Drinkard, LCSW-A 10/17/2014 12:07 PM  Signature:  Leisa LenzValerie Enoch, Care Coordinator [facility] 10/17/2014 12:07 PM  Signature:   10/17/2014 12:07 PM  Signature:  10/17/2014  12:07 PM  Signature:   Onnie BoerJennifer Clark, RN [facility] 10/17/2014  12:07 PM  Signature:  Santa GeneraAnne Cunningham Lead Social Worker LCSW 10/17/2014  12:07 PM    Scribe for Treatment Team:   Juline PatchQuylle Serina Nichter,  10/17/2014 12:07 PM

## 2014-10-17 NOTE — Progress Notes (Signed)
[facility]charge [facility] :  Will you be returning to the same living situation after discharge: Yes,  Patient is returning home with wife. At discharge, do you have transportation home?:Yes,  Patient to arrange transportation home. Do you have the ability to pay for your medications:Yes,  Patient is able to obtain medications.  Release of information consent forms completed and in the chart;  Patient's signature needed at discharge.  Patient to Follow up at: Follow-up Information    Follow up with [facility] On 10/29/2014.   Why:  You are   Contact information:   [address] [place], [place]   [address]  [phone]      Follow up with Glendell DockerMatthew McMillan, LPC On 10/27/2014.   Why:  You are scheduled with Glendell DockerMatthew McMillan on Monday, October 27, 2014 at 4:00 PM for counseling.  Please call if you are unable to keep this appointment.   Contact information:   [address] [place], [place]   [address]  [phone]      Patient denies SI/HI:  Patient no longer endorsing SI/HI or other thoughts of self harm.  Safety Planning and Suicide Prevention discussed: .Reviewed with all patients during discharge planning group  Isaah Furry, Joesph JulyQuylle Hairston 10/17/2014, 3:54 PM

## 2014-10-17 NOTE — [facility] Group Notes (Signed)
[facility] LCSW Group Therapy  Feelings Around Relapse 1:15 -2:30        10/17/2014 2:54 PM   Type of Therapy:  Group Therapy  Participation Level:  Appropriate  Participation Quality:  Appropriate  Affect:  Appropriate  Cognitive:  Attentive Appropriate  Insight:  Developing/Improving  Engagement in Therapy: Developing/Improving  Modes of Intervention:  Discussion Exploration Problem-Solving Supportive  Summary of Progress/Problems:  The topic for today was feelings around relapse.    Patient processed feelings toward relapse and was able to relate to peers. He shared relapsing for him would be drinking again.  Patient identified coping skills that can be used to prevent a relapse including staying close to family members who will not be drinking over the holidays.  He shared the holidays and his garage are his triggers to use.  Patient shared the garage is his man cave where he goes to drink.  Patient stated he and wife have discussed finding a place inside of the home where he can spend time and not be triggered by the garage.   Wynn BankerHodnett, Quylle Hairston 10/17/2014 2:54 PM

## 2014-10-17 NOTE — Plan of Care (Signed)
Problem: Ineffective individual coping Goal: STG: Patient will participate in after care plan Patient has attended groups and easily engaged in discussions. Outpatient follow up is scheduled with [facility] and [facility].  Joycie Aerts, LCSW 10/17/2014 12:07 PM Outcome: Completed/Met Date Met:  10/17/14

## 2014-10-17 NOTE — Plan of Care (Signed)
Problem: Alteration in mood & ability to function due to Goal: STG: Patient verbalizes decreases in signs of withdrawal Outcome: Progressing Client voices decrease in WD symptoms. "I feel better" "tremors and anxiety have decreased" "my stomach is better I have eaten today"

## 2014-10-17 NOTE — Plan of Care (Signed)
Problem: Ineffective individual coping Goal: STG: Patient will remain free from self harm Outcome: Progressing Patient remains free from self harm. 15 minute checks completed per protocol for pt safety.  Problem: Alteration in mood & ability to function due to Goal: LTG-Pt reports reduction in suicidal thoughts (Patient reports reduction in suicidal thoughts and is able to verbalize a safety plan for whenever patient is feeling suicidal)  Outcome: Progressing Patient denies any thoughts of SI today.

## 2014-10-17 NOTE — Progress Notes (Signed)
D: Patient is alert and oriented. Pt's mood and affect is "anxious" and appropriate for circumstance and pleasant. Pt is fidgety. Pt complains of right shoulder pain d/t recent surgery, 5/10. Pt denies SI/HI and AVH at this time. Pt is attending groups. A: PRN medications administered for pain per providers orders (See MAR). Scheduled medications administered per providers orders (See MAR). 15 minute checks completed per protocol for pt safety. R: Pt cooperative and receptive to nursing interventions.

## 2014-10-17 NOTE — Progress Notes (Signed)
Writer spoke with patient 1:1 and he requested pain medication for right shoulder pain which he recieved. He reports that his day has gone well other than his anxiety seems to increase once the ativan has worn off. He reports that he is to return to work on Monday and is hopeful to discharge on tomorrow. He reports that his 72 hours are up and he is under the impression that he will discharge on tomorrow, patient did not sign a 72 hour but he reports since coming voluntarily he thought he could be discharged. Writer explained to him the procedure for discharge and  Clinical research associatewriter encouraged patient to speak with doctor on tomorrow concerning discharge.  He denies si/hi/a/v hallucinations and reports that [facility]. Support and encouragement given, safety maintained on unit with 15 min checks.

## 2014-10-17 NOTE — Progress Notes (Signed)
[facility] Progress Note  10/17/2014 2:29 PM Marvin Cooper  MRN:  [id number] Subjective:  Marvin Cooper states he has been experiencing increased anxiety as he is being detox. He goes back to endorsing that he has an underlying depression. Has abstain up to 4 months and the mood has not gotten any better. 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 At work he does not have a problem as states it is repetitive computer based and he can "hyper focus." He endorses that he has experiences lack of energy, motivation, anhedonia decreased libido.  Diagnosis:   DSM5: Substance/Addictive Disorders:  Alcohol Related Disorder - Severe (303.90) Depressive Disorders:  Major Depressive Disorder - Moderate (296.22) Total Time spent with patient: 30 minutes  Axis I: Substance Induced Mood Disorder  ADL's:  Intact  Sleep: Fair  Appetite:  Fair  Psychiatric Specialty Exam: Physical Exam  ROS  Blood pressure 150/93, pulse 87, temperature 97.8 F (36.6 C), temperature source Oral, resp. rate 18, height 5\' 7"  (1.702 m), weight 95.255 kg (210 lb), SpO2 97 %.Body mass index is 32.88 kg/(m^2).  General Appearance: Fairly Groomed  Patent attorneyye Contact::  Fair  Speech:  Clear and Coherent  Volume:  Normal  Mood:  Anxious and Depressed  Affect:  Appropriate  Thought Process:  Coherent and Goal Directed  Orientation:  Full (Time, Place, and Person)  Thought Content:  symptoms events worries concerns  Suicidal Thoughts:  No  Homicidal Thoughts:  No  Memory:  Immediate;   Fair Recent;   Fair Remote;   Fair  Judgement:  Fair  Insight:  Present  Psychomotor Activity:  Normal  Concentration:  Fair  Recall:  FiservFair  Fund of Knowledge:NA  Language: Fair  Akathisia:  No  Handed:    AIMS (if indicated):     Assets:  Desire for Improvement Housing Social  Support Vocational/Educational  Sleep:  Number of Hours: 6.5   Musculoskeletal: Strength & Muscle Tone: within normal limits Gait & Station: normal Patient leans: N/A  Current Medications: Current Facility-Administered Medications  Medication Dose Route Frequency Provider Last Rate Last Dose  . acamprosate (CAMPRAL) tablet 666 mg  666 mg Oral TID WC Rachael FeeIrving A Salil Raineri, MD   666 mg at 10/17/14 1206  . acetaminophen (TYLENOL) tablet 650 mg  650 mg Oral Q6H PRN Kerry HoughSpencer E Simon, PA-C   650 mg at 10/17/14 0857  . albuterol (PROVENTIL HFA;VENTOLIN HFA) 108 (90 BASE) MCG/ACT inhaler 2 puff  2 puff Inhalation Q4H PRN Kerry HoughSpencer E Simon, PA-C      . alum & mag hydroxide-simeth (MAALOX/MYLANTA) 200-200-20 MG/5ML suspension 30 mL  30 mL Oral Q4H PRN Kerry HoughSpencer E Simon, PA-C      . benazepril (LOTENSIN) tablet 40 mg  40 mg Oral Daily Kerry HoughSpencer E Simon, PA-C   40 mg at 10/17/14 0854  . buPROPion (WELLBUTRIN XL) 24 hr tablet 150 mg  150 mg Oral Daily Rachael FeeIrving A Jaki Hammerschmidt, MD   150 mg at 10/17/14 0854  . citalopram (CELEXA) tablet 40 mg  40 mg Oral Daily Kerry HoughSpencer E Simon, PA-C   40 mg at 10/17/14 0854  . fenofibrate tablet 160 mg  160 mg Oral Daily Kerry HoughSpencer E Simon, PA-C   160 mg at 10/17/14 [id number]  . hydrOXYzine (ATARAX/VISTARIL) tablet 25 mg  25 mg Oral Q6H PRN Mena GoesSpencer E  Simon, PA-C      . loperamide (IMODIUM) capsule 2-4 mg  2-4 mg Oral PRN Kerry HoughSpencer E Simon, PA-C      . loratadine (CLARITIN) tablet 10 mg  10 mg Oral Daily Kerry HoughSpencer E Simon, PA-C   10 mg at 10/17/14 0854  . LORazepam (ATIVAN) tablet 1 mg  1 mg Oral Q6H PRN Kerry HoughSpencer E Simon, PA-C      . LORazepam (ATIVAN) tablet 1 mg  1 mg Oral TID Kerry HoughSpencer E Simon, PA-C   1 mg at 10/17/14 1206   Followed by  . [START ON 10/18/2014] LORazepam (ATIVAN) tablet 1 mg  1 mg Oral BID Kerry HoughSpencer E Simon, PA-C       Followed by  . [START ON 10/19/2014] LORazepam (ATIVAN) tablet 1 mg  1 mg Oral Daily Spencer E Simon, PA-C      . magnesium hydroxide (MILK OF MAGNESIA) suspension 30 mL  30 mL  Oral Daily PRN Kerry HoughSpencer E Simon, PA-C      . multivitamin with minerals tablet 1 tablet  1 tablet Oral Daily Kerry HoughSpencer E Simon, PA-C   1 tablet at 10/17/14 0854  . nicotine (NICODERM CQ - dosed in mg/24 hours) patch 14 mg  14 mg Transdermal Daily Kerry HoughSpencer E Simon, PA-C   14 mg at 10/17/14 0858  . ondansetron (ZOFRAN-ODT) disintegrating tablet 4 mg  4 mg Oral Q6H PRN Kerry HoughSpencer E Simon, PA-C   4 mg at 10/16/14 [id number]  . oxyCODONE-acetaminophen (PERCOCET/ROXICET) 5-325 MG per tablet 1 tablet  1 tablet Oral Q6H PRN Rachael FeeIrving A Einar Nolasco, MD   1 tablet at 10/17/14 1254  . simvastatin (ZOCOR) tablet 40 mg  40 mg Oral Daily Kerry HoughSpencer E Simon, PA-C   40 mg at 10/17/14 0854  . thiamine (VITAMIN B-1) tablet 100 mg  100 mg Oral Daily Kerry HoughSpencer E Simon, PA-C   100 mg at 10/17/14 [id number]  . traZODone (DESYREL) tablet 50 mg  50 mg Oral QHS,MR X 1 Kerry HoughSpencer E Simon, PA-C   50 mg at 10/16/14 2155    Lab Results: No results found for this or any previous visit (from the past 48 hour(s)).  Physical Findings: AIMS: Facial and Oral Movements Muscles of Facial Expression: None, normal Lips and Perioral Area: None, normal Jaw: None, normal Tongue: None, normal,Extremity Movements Upper (arms, wrists, hands, fingers): None, normal Lower (legs, knees, ankles, toes): None, normal, Trunk Movements Neck, shoulders, hips: None, normal, Overall Severity Severity of abnormal movements (highest score from questions above): None, normal Incapacitation due to abnormal movements: None, normal Patient's awareness of abnormal movements (rate only patient's report): No Awareness, Dental Status Current problems with teeth and/or dentures?: No Does patient usually wear dentures?: No  CIWA:  CIWA-Ar Total: 0 COWS:     Treatment Plan Summary: Daily contact with patient to assess and evaluate symptoms and progress in treatment Medication management  Plan: Supportive approach/coping skills/relapse prevention           Will pursue the Wellbutrin XL  150 mg in AM           Will also have Campral started 666 mg TID ( he endorses cravings)           Due to lethargy anhedonia decreased libido will check the Testosterone level and the              TSH  Medical Decision Making Problem Points:  Review of psycho-social stressors (1) Data Points:  Review or order clinical lab tests (1) Review of  medication regiment & side effects (2) Review of new medications or change in dosage (2)  I certify that inpatient services furnished can reasonably be expected to improve the patient's condition.   Martena Emanuele A 10/17/2014, 2:29 PM

## 2014-10-17 NOTE — [facility] Group Notes (Signed)
[facility]   10/17/2014 12:11 PM    Participation Quality:  Appropraite  Mood/Affect:  Appropriate  Depression Rating: 4  Anxiety Rating:  7  Thoughts of Suicide:  No  Will you contract for safety?   NA  Current AVH:  No  Plan for Discharge/Comments:  Patient attended discharge planning group and actively participated in group. He reports being better and hopes to discharge soon.  [facility] and Glendell DockerMatthew McMillan. Suicide prevention education reviewed and SPE document provided.   Transportation Means: Patient has transportation.   Supports:  Patient has a support system.   Tashya Alberty, Joesph JulyQuylle Hairston

## 2014-10-18 DIAGNOSIS — F1020 Alcohol dependence, uncomplicated: Secondary | ICD-10-CM

## 2014-10-18 DIAGNOSIS — F339 Major depressive disorder, recurrent, unspecified: Secondary | ICD-10-CM

## 2014-10-18 LAB — TESTOSTERONE: TESTOSTERONE: 334 ng/dL (ref 300–890)

## 2014-10-18 LAB — TSH: TSH: 4.64 u[IU]/mL — AB (ref 0.350–4.500)

## 2014-10-18 MED ORDER — NICOTINE 21 MG/24HR TD PT24
21.0000 mg | MEDICATED_PATCH | Freq: Every day | TRANSDERMAL | Status: DC
  Filled 2014-10-18 (×2): qty 1

## 2014-10-18 MED ORDER — CITALOPRAM HYDROBROMIDE 40 MG PO TABS: 40.0000 mg | ORAL_TABLET | Freq: Every day | ORAL | Status: DC

## 2014-10-18 MED ORDER — BUPROPION HCL ER (XL) 150 MG PO TB24: 150.0000 mg | ORAL_TABLET | Freq: Every day | ORAL | Status: DC

## 2014-10-18 MED ORDER — ACAMPROSATE CALCIUM 333 MG PO TBEC: 666.0000 mg | DELAYED_RELEASE_TABLET | Freq: Three times a day (TID) | ORAL | Status: DC

## 2014-10-18 MED ORDER — BENAZEPRIL HCL 40 MG PO TABS: 40.0000 mg | ORAL_TABLET | Freq: Every day | ORAL | Status: DC

## 2014-10-18 MED ORDER — ALBUTEROL SULFATE HFA 108 (90 BASE) MCG/ACT IN AERS: 2.0000 | INHALATION_SPRAY | RESPIRATORY_TRACT | Status: AC | PRN

## 2014-10-18 MED ORDER — SIMVASTATIN 40 MG PO TABS: 40.0000 mg | ORAL_TABLET | Freq: Every day | ORAL | Status: DC

## 2014-10-18 MED ORDER — OXYCODONE-ACETAMINOPHEN 5-325 MG PO TABS
ORAL_TABLET | ORAL | Status: AC
  Filled 2014-10-18: qty 1

## 2014-10-18 MED ORDER — TRAZODONE HCL 50 MG PO TABS: 50.0000 mg | ORAL_TABLET | Freq: Every evening | ORAL | Status: DC | PRN

## 2014-10-18 MED ORDER — FENOFIBRATE 150 MG PO CAPS: 150.0000 mg | ORAL_CAPSULE | Freq: Every day | ORAL | Status: DC

## 2014-10-18 MED ORDER — BENAZEPRIL HCL 10 MG PO TABS
40.0000 mg | ORAL_TABLET | Freq: Every day | ORAL | Status: DC
  Filled 2014-10-18 (×2): qty 4

## 2014-10-18 MED ORDER — OXYCODONE-ACETAMINOPHEN 5-325 MG PO TABS: 1.0000 | ORAL_TABLET | Freq: Four times a day (QID) | ORAL | Status: DC | PRN

## 2014-10-18 MED ORDER — LORATADINE 10 MG PO TABS: 10.0000 mg | ORAL_TABLET | Freq: Every day | ORAL | Status: DC

## 2014-10-18 NOTE — Progress Notes (Signed)
Marvin Cooper Adult Case Management Discharge Plan :  Will you be returning to the same living situation after discharge: Yes,  home with family At discharge, do you have transportation home?:Yes,  transportation provided by wife Do you have the ability to pay for your medications:Yes,  provided with 30-day prescription  Release of information consent forms completed and in the chart;  Patient's signature needed at discharge.  Patient to Follow up at: Follow-up Information    Follow up with [facility]feen [facility] On 10/29/2014.   Why:  You are   Contact information:   [address] [place], [place]   [address]  [phone]      Follow up with Marvin DockerMatthew Cooper, LPC On 10/27/2014.   Why:  You are scheduled with Marvin DockerMatthew Cooper on Monday, October 27, 2014 at 4:00 PM for counseling.  Please call if you are unable to keep this appointment.   Contact information:   [address] [place], [place]   [address]  [phone]      Patient denies SI/HI:   Yes,  Pt denies    Safety Planning and Suicide Prevention discussed:  Yes,  Pt provided with suicide prevention educationElaina Hoops'  Cooper, Marvin Cooper M 10/18/2014, 4:47 PM

## 2014-10-18 NOTE — Progress Notes (Signed)
D) Pt being discharged to home accompanied by his wife. Affect and mood are appropriate. Pt denies SI and HI. Rates his depression at a 5, hopelessness at a 0, and his anxiety at a 5. States he feels better overall and states, "I am going to follow up and go for outpatient help so I can stop drinking".  A) Pt given support and reassurance along with praise. All his medications explained to him, along with information about his follow up plans. Pt did not have any belongings locked up. Provided with a 1:1.  R) Pt denies SI and HI.

## 2014-10-18 NOTE — Progress Notes (Signed)
.  Psychoeducational Group Note    Date: 10/18/2014 Time:  0930    Goal Setting Purpose of Group: To be able to set a goal that is measurable and that can be accomplished in one day Participation Level:  Active  Participation Quality:  Appropriate  Affect:  Flat  Cognitive:  Oriented  Insight:  Improving  Engagement in Group:  Engaged  Additional Comments:  Pt attended the group and was engaged  [name]  

## 2014-10-18 NOTE — Plan of Care (Signed)
Problem: Alteration in mood & ability to function due to Goal: STG-Patient will comply with prescribed medication regimen (Patient will comply with prescribed medication regimen)  Outcome: Progressing Patient is compliant with scheduled hs medications.

## 2014-10-18 NOTE — [facility] Suicide Risk Assessment (Signed)
Suicide Risk Assessment  Discharge Assessment     Demographic Factors:  Male  Total Time spent with patient: 30 minutes  Psychiatric Specialty Exam:     Blood pressure 141/93, pulse 91, temperature 97.4 F (36.3 C), temperature source Oral, resp. rate 16, height 5\' 7"  (1.702 m), weight 210 lb (95.255 kg), SpO2 97 %.Body mass index is 32.88 kg/(m^2).  General Appearance: Casual  Eye Contact::  Fair  Speech:  Slow  Volume:  Normal  Mood:  Euthymic  Affect:  Congruent  Thought Process:  Coherent  Orientation:  Full (Time, Place, and Person)  Thought Content:  denies delusion, hallucinations  Suicidal Thoughts:  No  Homicidal Thoughts:  No  Memory:  Immediate;   Fair Recent;   Fair  Judgement:  Fair  Insight:  Fair  Psychomotor Activity:  Normal  Concentration:  Fair  Recall:  FiservFair  Fund of Knowledge:Fair  Language: Fair  Akathisia:  Negative  Handed:  Right  AIMS (if indicated):     Assets:  Communication Skills Desire for Improvement Financial Resources/Insurance Housing Intimacy  Sleep:  Number of Hours: 6.25    Musculoskeletal: Strength & Muscle Tone: within normal limits Gait & Station: normal Patient leans: N/A   Mental Status Per Nursing Assessment::   On Admission:  NA  Current Mental Status by Physician: see MS above  Loss Factors: Decrease in vocational status  Historical Factors: holidays stress  Risk Reduction Factors:   Sense of responsibility to family and Positive coping skills or problem solving skills  Continued Clinical Symptoms:  Dysthymia Alcohol/Substance Abuse/Dependencies  Cognitive Features That Contribute To Risk:  Closed-mindedness    Suicide Risk:  Minimal: No identifiable suicidal ideation.  Patients presenting with no risk factors but with morbid ruminations; may be classified as minimal risk based on the severity of the depressive symptoms  Discharge Diagnoses:   AXIS I:  Major Depression, Recurrent severe,  Substance Induced Mood Disorder and alcohol use disorder, moderate AXIS II:  Deferred AXIS III:   Past Medical History  Diagnosis Date  . Hypertension   . Hypercholesteremia   . Alcohol abuse   . Asthma    AXIS IV:  other psychosocial or environmental problems AXIS V:  51-60 moderate symptoms  Plan Of Care/Follow-up recommendations:  Activity:  as tolerated Diet:  regular Follow up with outpatient recommendations . AA and appointments. Compliance withmeds. See DC summary for details.  Is patient on multiple antipsychotic therapies at discharge:  No   Has Patient had three or more failed trials of antipsychotic monotherapy by history:  No  Recommended Plan for Multiple Antipsychotic Therapies: NA    Suellen Durocher 10/18/2014, 9:28 AM

## 2014-10-18 NOTE — Discharge Summary (Signed)
Physician Discharge Summary Note  Patient:  Marvin Cooper is an 43 y.o., male MRN:  [id number] DOB:  November 16, 1970 Patient phone:  [phone] (home)  Patient address:   [address] [place] [address],  Total Time spent with patient: Greater than 30 minutes  Date of Admission:  10/15/2014 Date of Discharge: 10/18/14  Reason for Admission:  Alcohol detox  Discharge Diagnoses: Active Problems:   Alcohol dependence   Major depression, recurrent   Psychiatric Specialty Exam: Physical Exam  Psychiatric: His speech is normal and behavior is normal. Judgment and thought content normal. His mood appears not anxious. His affect is not angry, not blunt, not labile and not inappropriate. Cognition and memory are normal. He does not exhibit a depressed mood.    Review of Systems  Constitutional: Negative.   HENT: Negative.   Eyes: Negative.   Respiratory: Negative.   Cardiovascular: Negative.   Gastrointestinal: Negative.   Genitourinary: Negative.   Musculoskeletal: Negative.   Skin: Negative.   Neurological: Negative.   Endo/Heme/Allergies: Negative.   Psychiatric/Behavioral: Positive for depression (Stable) and substance abuse (Alcohol dependence). Negative for suicidal ideas, hallucinations and memory loss. The patient has insomnia (Stable). The patient is not nervous/anxious.     Blood pressure 141/93, pulse 91, temperature 97.4 F (36.3 C), temperature source Oral, resp. rate 16, height 5\' 7"  (1.702 m), weight 95.255 kg (210 lb), SpO2 97 %.Body mass index is 32.88 kg/(m^2).  See Md's SRA               Past Psychiatric History: Diagnosis:  Hospitalizations:  Outpatient Care:  Substance Abuse Care:  Self-Mutilation:  Suicidal Attempts:  Violent Behaviors:   Musculoskeletal: Strength & Muscle Tone: within normal limits Gait & Station: normal Patient leans: N/A  DSM5: Schizophrenia Disorders:  NA Obsessive-Compulsive Disorders:  NA Trauma-Stressor  Disorders:  NA Substance/Addictive Disorders:  Alcohol Related Disorder - Severe (303.90) Depressive Disorders:  Major depressive disorder, recurrent  Axis Diagnosis:   AXIS I:  Alcohol dependence, Major depressive disorder, recurrent episodes AXIS II:  Deferred AXIS III:   Past Medical History  Diagnosis Date  . Hypertension   . Hypercholesteremia   . Alcohol abuse   . Asthma    AXIS IV:  other psychosocial or environmental problems and Alcoholism, chronic AXIS V:  63  Level of Care:  OP  Hospital Course:  43 Y/O male who was in this unit in August 2014. After D/C he pursued outpatient treatment. He states he did well for a while but then relapsed. Went to [facility] in the spring of this year. States he did well for four months. Come Thanksgiving he was off for a week and he started drinking. States he is drinking a lot of [place], and Beer until passes out. Tried to detox but could not. States he has realized that there is an underlying depression that has been there even when he has abstained from drinking. He has been on Celexa for few years but it does not seem to be taking care of it. He has gone off the Celexa up for a week but he experiences severe withdrawal and has to go back on it. He is in couples counseling and feels the relationship had been getting better until this last relapse.  Marvin Cooper was admitted to the hospital for alcohol detoxification treatments. His blood alcohol level upon admission was 337 per toxicology tests reports, and UDS test results positive for THC. He was intoxicated. His lab reports also indicated  elevated liver enzymes from chronic alcoholism. As a result, not a candidate for Librium detox protocols. This is because, Librium is a long acting Benzodiazepine with a long half-life. If used for this particular detox treatment will impose heavily on already compromised liver functions. By using ativan detox regimen, Marvin Cooper received a cleaner detox treatment  without imposing on his liver fuctions any further.  Besides the detoxification treatment, Marvin Cooper also was medicated and discharged on Trazodone 50 mg Q bedtime for sleep, CAMPRAL 666 mg three times daily for alcoholism, Citalopram 40 mg daily for depression and Wellbutrin XL 150 mg Q daily for depression. He also received other medication management for his other medical issues that he presented. He tolerated his treatment regimen without any significant adverse effects and or reactions. Marvin Cooper was enrolled & participated in the AA/NA meetings and group counseling sessions being offered and held on this unit. He learned coping skills.  Marvin Cooper has completed detox treatments. He is currently being discharged to his home to continue treatment on an outpatient basis including counseling sessions. He has been given all the necessary information needed to make these appointments without problems. Upon discharge, he denies any SIHI, AVH, delusional thoughts, paranoia and or withdrawal symptoms. he received a 4 days worth supply samples of his Marvin Cooper discharge medications. He left [facility] with all personal belongings in no distress. Transportation per wife.   Consults:  psychiatry   Consults:  psychiatry  Significant Diagnostic Studies:  labs: CBC with diff, CMP, UDS, toxicology tests, U/A, results reviewed, no changes  Discharge Vitals:   Blood pressure 141/93, pulse 91, temperature 97.4 F (36.3 C), temperature source Oral, resp. rate 16, height 5\' 7"  (1.702 m), weight 95.255 kg (210 lb), SpO2 97 %. Body mass index is 32.88 kg/(m^2). Lab Results:   No results found for this or any previous visit (from the past 72 hour(s)).  Physical Findings: AIMS: Facial and Oral Movements Muscles of Facial Expression: None, normal Lips and Perioral Area: None, normal Jaw: None, normal Tongue: None, normal,Extremity Movements Upper (arms, wrists, hands, fingers): None, normal Lower (legs, knees, ankles, toes): None,  normal, Trunk Movements Neck, shoulders, hips: None, normal, Overall Severity Severity of abnormal movements (highest score from questions above): None, normal Incapacitation due to abnormal movements: None, normal Patient's awareness of abnormal movements (rate only patient's report): No Awareness, Dental Status Current problems with teeth and/or dentures?: No Does patient usually wear dentures?: No  CIWA:  CIWA-Ar Total: 0 COWS:     Psychiatric Specialty Exam: See Psychiatric Specialty Exam and Suicide Risk Assessment completed by Attending Physician prior to discharge.  Discharge destination:  Home  Is patient on multiple antipsychotic therapies at discharge:  No   Has Patient had three or more failed trials of antipsychotic monotherapy by history:  No  Recommended Plan for Multiple Antipsychotic Therapies: NA    Medication List    STOP taking these medications        niacin 500 MG CR capsule     omega-3 acid ethyl esters 1 G capsule  Commonly known as:  LOVAZA      TAKE these medications      Indication   acamprosate 333 MG tablet  Commonly known as:  CAMPRAL  Take 2 tablets (666 mg total) by mouth 3 (three) times daily with meals. For alcohol dependence   Indication:  Excessive Use of Alcohol     albuterol 108 (90 BASE) MCG/ACT inhaler  Commonly known as:  PROVENTIL HFA;VENTOLIN HFA  Inhale  2 puffs into the lungs every 4 (four) hours as needed for wheezing or shortness of breath.   Indication:  Asthma     benazepril 40 MG tablet  Commonly known as:  LOTENSIN  Take 1 tablet (40 mg total) by mouth daily. For high blood pressure control   Indication:  High Blood Pressure     buPROPion 150 MG 24 hr tablet  Commonly known as:  WELLBUTRIN XL  Take 1 tablet (150 mg total) by mouth daily. For depression   Indication:  Major Depressive Disorder     citalopram 40 MG tablet  Commonly known as:  CELEXA  Take 1 tablet (40 mg total) by mouth daily. For depression    Indication:  Depression     Fenofibrate 150 MG Caps  Take 1 capsule (150 mg total) by mouth daily. For cholesterol control   Indication:  Inherited Heterozygous Hypercholesterolemia, Nonfamilial Heterozygous Hypercholesterolemia     loratadine 10 MG tablet  Commonly known as:  CLARITIN  Take 1 tablet (10 mg total) by mouth daily. For allergies   Indication:  Perennial Rhinitis, Hayfever     oxyCODONE-acetaminophen 5-325 MG per tablet  Commonly known as:  PERCOCET/ROXICET  Take 1 tablet by mouth every 6 (six) hours as needed for severe pain. For severe pain   Indication:  For R shoulder surgical pain     simvastatin 40 MG tablet  Commonly known as:  ZOCOR  Take 1 tablet (40 mg total) by mouth daily. For high cholesterol control   Indication:  Inherited Homozygous Hypercholesterolemia, Nonfamilial Heterozygous Hypercholesterolemia     traZODone 50 MG tablet  Commonly known as:  DESYREL  Take 1 tablet (50 mg total) by mouth at bedtime and may repeat dose one time if needed. For sleep   Indication:  Trouble Sleeping       Follow-up Information    [facility] with [facility] On 10/29/2014.   Why:  You are   Contact information:   [address] [place], [place]   [address]  [phone]      Follow up with Glendell DockerMatthew McMillan, LPC On 10/27/2014.   Why:  You are scheduled with Glendell DockerMatthew McMillan on Monday, October 27, 2014 at 4:00 PM for counseling.  Please call if you are unable to keep this appointment.   Contact information:   [address] [place], [place]   [address]  [phone]     Follow-up recommendations:  Activity:  As tolerated Diet: As recommended by your primary care doctor. Keep all scheduled follow-up appointments as recommended.  Comments:  Take all your medications as prescribed by your mental healthcare provider. Report any adverse effects and or reactions from your medicines to your outpatient provider  promptly. Patient is instructed and cautioned to not engage in alcohol and or illegal drug use while on prescription medicines. In the event of worsening symptoms, patient is instructed to call the crisis hotline, 911 and or go to the nearest ED for appropriate evaluation and treatment of symptoms. Follow-up with your primary care provider for your other medical issues, concerns and or health care needs.   Total Discharge Time:  Greater than 30 minutes.  Signed: Armandina StammerNwoko, Agnes I, PMHNP-BC 10/18/2014, 10:08 AM I have examined the patient and agree with the discharge plan and findings. I have also done suicide assessment on the patient.

## 2014-10-18 NOTE — [facility] Group Notes (Signed)
[facility] LCSW Group Therapy 10/18/2014 10:00am  Type of Therapy and Topic: Group Therapy: Avoiding Self-Sabotaging and Enabling Behaviors   Participation Level: Active  Description of Group:  Learn how to identify obstacles, self-sabotaging and enabling behaviors, what are they, why do we do them and what needs do these behaviors meet? Discuss unhealthy relationships and how to have positive healthy boundaries with those that sabotage and enable. Explore aspects of self-sabotage and enabling in yourself and how to limit these self-destructive behaviors in everyday life. A scaling question is used to help patient look at where they are now in their motivation to change, from 1 to 10 (lowest to highest motivation).   Therapeutic Goals:  1. Patient will identify one obstacle that relates to self-sabotage and enabling behaviors 2. Patient will identify one personal self-sabotaging or enabling behavior they did prior to admission 3. Patient able to establish a plan to change the above identified behavior they did prior to admission:  4. Patient will demonstrate ability to communicate their needs through discussion and/or role plays.  Summary of Patient Progress:  Pt participated actively in group, identifying isolation and alcohol abuse as self-sabotaging behaviors. Pt also reports that his family who previously did not seek to understand his mental health and addiction issues were sabotaging to his wellness as they caused him to have panic attacks. Per Pt, family sought out educational materials and classes and have become an effective support system.   Therapeutic Modalities:  Cognitive Behavioral Therapy  Person-Centered Therapy  Motivational Interviewing   Chad CordialLauren Carter, LCSWA 10/18/2014 2:52 PM

## 2014-10-20 LAB — TESTOSTERONE, FREE: Testosterone, Free: 80.6 pg/mL (ref 47.0–244.0)

## 2014-10-20 LAB — TESTOSTERONE, % FREE: TESTOSTERONE-% FREE: 2.4 % — AB (ref 1.6–2.9)

## 2014-10-20 LAB — SEX HORMONE BINDING GLOBULIN: Sex Hormone Binding: 23 nmol/L (ref 13–71)

## 2014-10-20 NOTE — Progress Notes (Signed)
Patient Discharge Instructions:  After Visit Summary (AVS):   Faxed to:  10/20/14 Discharge Summary Note:   Faxed to:  10/20/14 Psychiatric Admission Assessment Note:   Faxed to:  10/20/14 Suicide Risk Assessment - Discharge Assessment:   Faxed to:  10/20/14 Faxed/Sent to the Next Level Care provider:  10/20/14 Next Level Care Provider Has Access to the EMR, 10/20/14 [facility] access. Faxed to Glendell DockerMatthew McMillan @ [phone]  Jerelene ReddenSheena E [place], 10/20/2014, 2:46 PM

## 2014-10-29 ENCOUNTER — Ambulatory Visit (INDEPENDENT_AMBULATORY_CARE_PROVIDER_SITE_OTHER): Payer: BC Managed Care – PPO | Admitting: Psychiatry

## 2014-10-29 ENCOUNTER — Encounter (HOSPITAL_COMMUNITY): Payer: Self-pay | Admitting: Psychiatry

## 2014-10-29 VITALS — BP 134/86 | HR 84 | Ht 67.0 in | Wt 214.4 lb

## 2014-10-29 DIAGNOSIS — F1210 Cannabis abuse, uncomplicated: Secondary | ICD-10-CM

## 2014-10-29 DIAGNOSIS — F1010 Alcohol abuse, uncomplicated: Secondary | ICD-10-CM

## 2014-10-29 DIAGNOSIS — F331 Major depressive disorder, recurrent, moderate: Secondary | ICD-10-CM

## 2014-10-29 DIAGNOSIS — F339 Major depressive disorder, recurrent, unspecified: Secondary | ICD-10-CM

## 2014-10-29 MED ORDER — TRAZODONE HCL 50 MG PO TABS
50.0000 mg | ORAL_TABLET | Freq: Every evening | ORAL | Status: DC | PRN
Start: 2014-10-29 — End: 2014-11-28

## 2014-10-29 MED ORDER — CITALOPRAM HYDROBROMIDE 20 MG PO TABS: 20.0000 mg | ORAL_TABLET | Freq: Every day | ORAL | Status: DC

## 2014-10-29 MED ORDER — BUPROPION HCL ER (XL) 300 MG PO TB24: 300.0000 mg | ORAL_TABLET | Freq: Every day | ORAL | Status: DC

## 2014-10-29 MED ORDER — ACAMPROSATE CALCIUM 333 MG PO TBEC: 666.0000 mg | DELAYED_RELEASE_TABLET | Freq: Three times a day (TID) | ORAL | Status: DC
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
## 2014-11-22 ENCOUNTER — Other Ambulatory Visit (HOSPITAL_COMMUNITY): Payer: Self-pay | Admitting: Psychiatry

## 2014-11-24 ENCOUNTER — Other Ambulatory Visit (HOSPITAL_COMMUNITY): Payer: Self-pay | Admitting: Psychiatry

## 2014-11-25 ENCOUNTER — Other Ambulatory Visit (HOSPITAL_COMMUNITY): Payer: Self-pay | Admitting: Psychiatry

## 2014-11-27 ENCOUNTER — Other Ambulatory Visit (HOSPITAL_COMMUNITY): Payer: Self-pay

## 2014-11-27 NOTE — Telephone Encounter (Signed)
Refill fax request for Citalopram and Wellbutrin received today.  Patient has an appointment 11/28/14 for medication evaluation follow up and refills.

## 2014-11-28 ENCOUNTER — Encounter (HOSPITAL_COMMUNITY): Payer: Self-pay | Admitting: Psychiatry

## 2014-11-28 ENCOUNTER — Ambulatory Visit (INDEPENDENT_AMBULATORY_CARE_PROVIDER_SITE_OTHER): Payer: BLUE CROSS/BLUE SHIELD | Admitting: Psychiatry

## 2014-11-28 VITALS — BP 146/77 | HR 111 | Ht 67.0 in | Wt 213.4 lb

## 2014-11-28 DIAGNOSIS — F1010 Alcohol abuse, uncomplicated: Secondary | ICD-10-CM

## 2014-11-28 DIAGNOSIS — F331 Major depressive disorder, recurrent, moderate: Secondary | ICD-10-CM

## 2014-11-28 DIAGNOSIS — F1210 Cannabis abuse, uncomplicated: Secondary | ICD-10-CM

## 2014-11-28 MED ORDER — ACAMPROSATE CALCIUM 333 MG PO TBEC: 666.0000 mg | DELAYED_RELEASE_TABLET | Freq: Three times a day (TID) | ORAL | Status: DC

## 2014-11-28 MED ORDER — TRAZODONE HCL 100 MG PO TABS: 100.0000 mg | ORAL_TABLET | Freq: Every evening | ORAL | Status: DC | PRN

## 2014-11-28 MED ORDER — BUPROPION HCL ER (XL) 300 MG PO TB24: 300.0000 mg | ORAL_TABLET | Freq: Every day | ORAL | Status: DC

## 2014-11-28 MED ORDER — CITALOPRAM HYDROBROMIDE 20 MG PO TABS: 20.0000 mg | ORAL_TABLET | Freq: Every day | ORAL | Status: DC

## 2014-11-28 MED ORDER — HYDROXYZINE PAMOATE 25 MG PO CAPS: 25.0000 mg | ORAL_CAPSULE | ORAL | Status: DC | PRN

## 2014-11-28 NOTE — Progress Notes (Signed)
[facility] [phone] Progress Note  Marvin Cooper [id number] 44 y.o.  11/28/2014 10:32 AM  Chief Complaint:  I still have anxiety and nervousness but I'm feeling better.  I like Wellbutrin.   History of Present Illness:  Marvin Cooper came for her follow-up appointment.  He was seen first time 2 weeks ago when he is referred from inpatient psychiatric services.  He was admitted because of heavy drinking and having a lot of anxiety and depressive symptoms.  We increased his Wellbutrin and recommended to decrease Celexa.  He continues to have anxiety and nervousness but overall his depression is much improved.  He became very anxious and nervous when he was told to do presentation.  Patient is glad that as a nation did very well however when he finished the presentation he had panic attack and he has to leave his office.  Patient takes trazodone at night but requires 2 tablet every night.  He is taking Campral which is helping her alcohol craving however he admitted 1 slip when he was very anxious and nervous.  He also endorse smoking weed once but overall he feels his depression is getting better.  He has no tremors or shakes.  He is seeing Marvin Cooper every week which is helping his coping skills.  Patient denies any crying spells, irritability, anger or any feeling of hopelessness or worthlessness.  He has no tremors or shakes.  He denies any paranoia or any hallucination.  He is wondering if he can given something to help his anxiety and panic attack.  His appetite is okay.  His vital signs are normal.  Suicidal Ideation: No Plan Formed: No Patient has means to carry out plan: No  Homicidal Ideation: No Plan Formed: No Patient has means to carry out plan: No  Past Psychiatric History/Hospitalization(s) Patient has at least 2 admission for alcohol detox and depression.  He was recently discharged on December 5 from [facility] after detox treatment and depression.  His blood  alcohol level was more than 300 and he was positive for marijuana.  He has another detox treatment in 2012.  At that time he was very depressed and intoxicated dealing with his son's accident in which his son lost his leg and require amputation.  He is taking Celexa from his primary care physician started 2013.  Patient denies any history of paranoia, hallucination, mania, aggression, suicidal attempt or psychosis.  He denies any history of PTSD and OCD symptoms.  He has DUI in 1997. Anxiety: Yes Bipolar Disorder: No Depression: Yes Mania: No Psychosis: No Schizophrenia: No Personality Disorder: No Hospitalization for psychiatric illness: Yes History of Electroconvulsive Shock Therapy: No Prior Suicide Attempts: No  Medical History; Patient has history of high liver enzymes, high triglyceride, hypertension and high cholesterol.  His primary care physician is Dr. Corine Cooper at [facility] physician.  Education and Work History; Patient is working at [employer].  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  Psychosocial History; Patient born and raised in [place].  He moved to [facility] in 1997 after his divorce from his first marriage.  He has one son from his previous marriage who is now 44 year old lives by himself.  Patient is married with his current wife for more than 10 years.  Together they have a 69-year-old daughter.  Review of Systems  Constitutional: Negative for malaise/fatigue.  Neurological: Negative for weakness.  Psychiatric/Behavioral: Positive for suicidal ideas. The patient is nervous/anxious and has insomnia.  Have slip of alcohol and cannabis once     Psychiatric: Agitation: No Hallucination: No Depressed Mood: No Insomnia: No Hypersomnia: No Altered Concentration: No Feels Worthless: No Grandiose Ideas: No Belief In Special Powers: No New/Increased Substance Abuse: No Compulsions: No  Neurologic: Headache: No Seizure: No Paresthesias:  No   Musculoskeletal: Strength & Muscle Tone: within normal limits Gait & Station: normal Patient leans: N/A   Outpatient Encounter Prescriptions as of 11/28/2014  Medication Sig  . acamprosate (CAMPRAL) 333 MG tablet Take 2 tablets (666 mg total) by mouth 3 (three) times daily with meals. For alcohol dependence  . albuterol (PROVENTIL HFA;VENTOLIN HFA) 108 (90 BASE) MCG/ACT inhaler Inhale 2 puffs into the lungs every 4 (four) hours as needed for wheezing or shortness of breath.  . benazepril (LOTENSIN) 40 MG tablet Take 1 tablet (40 mg total) by mouth daily. For high blood pressure control  . buPROPion (WELLBUTRIN XL) 300 MG 24 hr tablet Take 1 tablet (300 mg total) by mouth daily. For depression  . citalopram (CELEXA) 20 MG tablet Take 1 tablet (20 mg total) by mouth daily. For depression  . Fenofibrate 150 MG CAPS Take 1 capsule (150 mg total) by mouth daily. For cholesterol control  . hydrOXYzine (VISTARIL) 25 MG capsule Take 1 capsule (25 mg total) by mouth as needed for anxiety.  Marland Kitchen loratadine (CLARITIN) 10 MG tablet Take 1 tablet (10 mg total) by mouth daily. For allergies  . niacin (NIASPAN) 500 MG CR tablet   . omega-3 acid ethyl esters (LOVAZA) 1 G capsule   . simvastatin (ZOCOR) 40 MG tablet Take 1 tablet (40 mg total) by mouth daily. For high cholesterol control  . traZODone (DESYREL) 100 MG tablet Take 1 tablet (100 mg total) by mouth at bedtime and may repeat dose one time if needed. For sleep  . [DISCONTINUED] acamprosate (CAMPRAL) 333 MG tablet Take 2 tablets (666 mg total) by mouth 3 (three) times daily with meals. For alcohol dependence  . [DISCONTINUED] buPROPion (WELLBUTRIN XL) 300 MG 24 hr tablet Take 1 tablet (300 mg total) by mouth daily. For depression  . [DISCONTINUED] citalopram (CELEXA) 20 MG tablet Take 1 tablet (20 mg total) by mouth daily. For depression  . [DISCONTINUED] oxyCODONE-acetaminophen (PERCOCET/ROXICET) 5-325 MG per tablet Take 1 tablet by mouth every  6 (six) hours as needed for severe pain. For severe pain  . [DISCONTINUED] traZODone (DESYREL) 50 MG tablet Take 1 tablet (50 mg total) by mouth at bedtime and may repeat dose one time if needed. For sleep    Recent Results (from the past 2160 hour(s))  Urinalysis, Routine w reflex microscopic     Status: Abnormal   Collection Time: 09/26/14  2:10 PM  Result Value Ref Range   Color, Urine YELLOW YELLOW   APPearance CLEAR CLEAR   Specific Gravity, Urine 1.005 1.005 - 1.030   pH 7.0 5.0 - 8.0   Glucose, UA NEGATIVE NEGATIVE mg/dL   Hgb urine dipstick NEGATIVE NEGATIVE   Bilirubin Urine NEGATIVE NEGATIVE   Ketones, ur NEGATIVE NEGATIVE mg/dL   Protein, ur 30 (A) NEGATIVE mg/dL   Urobilinogen, UA 0.2 0.0 - 1.0 mg/dL   Nitrite NEGATIVE NEGATIVE   Leukocytes, UA NEGATIVE NEGATIVE  Drug screen panel, emergency     Status: Abnormal   Collection Time: 09/26/14  2:10 PM  Result Value Ref Range   Opiates NONE DETECTED NONE DETECTED   Cocaine NONE DETECTED NONE DETECTED   Benzodiazepines NONE DETECTED NONE DETECTED  Amphetamines NONE DETECTED NONE DETECTED   Tetrahydrocannabinol POSITIVE (A) NONE DETECTED   Barbiturates NONE DETECTED NONE DETECTED    Comment:        DRUG SCREEN FOR MEDICAL PURPOSES ONLY.  IF CONFIRMATION IS NEEDED FOR ANY PURPOSE, NOTIFY LAB WITHIN 5 DAYS.        LOWEST DETECTABLE LIMITS FOR URINE DRUG SCREEN Drug Class       Cutoff (ng/mL) Amphetamine      1000 Barbiturate      200 Benzodiazepine   269 Tricyclics       485 Opiates          300 Cocaine          300 THC              50   Urine microscopic-add on     Status: Abnormal   Collection Time: 09/26/14  2:10 PM  Result Value Ref Range   Squamous Epithelial / LPF RARE RARE   WBC, UA 0-2 <3 WBC/hpf   RBC / HPF 0-2 <3 RBC/hpf   Bacteria, UA MANY (A) RARE  CBC with Differential     Status: Abnormal   Collection Time: 09/26/14  2:35 PM  Result Value Ref Range   WBC 12.5 (H) 4.0 - 10.5 K/uL   RBC 5.19  4.22 - 5.81 MIL/uL   Hemoglobin 15.9 13.0 - 17.0 g/dL   HCT 46.7 39.0 - 52.0 %   MCV 90.0 78.0 - 100.0 fL   MCH 30.6 26.0 - 34.0 pg   MCHC 34.0 30.0 - 36.0 g/dL   RDW 14.2 11.5 - 15.5 %   Platelets 270 150 - 400 K/uL   Neutrophils Relative % 58 43 - 77 %   Neutro Abs 7.2 1.7 - 7.7 K/uL   Lymphocytes Relative 37 12 - 46 %   Lymphs Abs 4.6 (H) 0.7 - 4.0 K/uL   Monocytes Relative 4 3 - 12 %   Monocytes Absolute 0.5 0.1 - 1.0 K/uL   Eosinophils Relative 1 0 - 5 %   Eosinophils Absolute 0.1 0.0 - 0.7 K/uL   Basophils Relative 0 0 - 1 %   Basophils Absolute 0.0 0.0 - 0.1 K/uL  Comprehensive metabolic panel     Status: Abnormal   Collection Time: 09/26/14  2:35 PM  Result Value Ref Range   Sodium 141 137 - 147 mEq/L   Potassium 4.1 3.7 - 5.3 mEq/L   Chloride 97 96 - 112 mEq/L   CO2 25 19 - 32 mEq/L   Glucose, Bld 110 (H) 70 - 99 mg/dL   BUN 19 6 - 23 mg/dL   Creatinine, Ser 0.70 0.50 - 1.35 mg/dL   Calcium 9.5 8.4 - 10.5 mg/dL   Total Protein 8.1 6.0 - 8.3 g/dL   Albumin 4.4 3.5 - 5.2 g/dL   AST 19 0 - 37 U/L   ALT 23 0 - 53 U/L   Alkaline Phosphatase 76 39 - 117 U/L   Total Bilirubin <0.2 (L) 0.3 - 1.2 mg/dL   GFR calc non Af Amer >90 >90 mL/min   GFR calc Af Amer >90 >90 mL/min    Comment: (NOTE) The eGFR has been calculated using the CKD EPI equation. This calculation has not been validated in all clinical situations. eGFR's persistently <90 mL/min signify possible Chronic Kidney Disease.    Anion gap 19 (H) 5 - 15  Lipase, blood     Status: None   Collection Time: 09/26/14  2:35  PM  Result Value Ref Range   Lipase 38 11 - 59 U/L  Ethanol     Status: Abnormal   Collection Time: 09/26/14  2:35 PM  Result Value Ref Range   Alcohol, Ethyl (B) 295 (H) 0 - 11 mg/dL    Comment:        LOWEST DETECTABLE LIMIT FOR SERUM ALCOHOL IS 11 mg/dL FOR MEDICAL PURPOSES ONLY   Urine Drug Screen     Status: Abnormal   Collection Time: 10/15/14  9:19 AM  Result Value Ref Range    Opiates NONE DETECTED NONE DETECTED   Cocaine NONE DETECTED NONE DETECTED   Benzodiazepines NONE DETECTED NONE DETECTED   Amphetamines NONE DETECTED NONE DETECTED   Tetrahydrocannabinol POSITIVE (A) NONE DETECTED   Barbiturates NONE DETECTED NONE DETECTED    Comment:        DRUG SCREEN FOR MEDICAL PURPOSES ONLY.  IF CONFIRMATION IS NEEDED FOR ANY PURPOSE, NOTIFY LAB WITHIN 5 DAYS.        LOWEST DETECTABLE LIMITS FOR URINE DRUG SCREEN Drug Class       Cutoff (ng/mL) Amphetamine      1000 Barbiturate      200 Benzodiazepine   102 Tricyclics       725 Opiates          300 Cocaine          300 THC              50   CBC     Status: None   Collection Time: 10/15/14  9:34 AM  Result Value Ref Range   WBC 7.9 4.0 - 10.5 K/uL   RBC 5.00 4.22 - 5.81 MIL/uL   Hemoglobin 15.6 13.0 - 17.0 g/dL   HCT 46.7 39.0 - 52.0 %   MCV 93.4 78.0 - 100.0 fL   MCH 31.2 26.0 - 34.0 pg   MCHC 33.4 30.0 - 36.0 g/dL   RDW 14.6 11.5 - 15.5 %   Platelets 265 150 - 400 K/uL  Comprehensive metabolic panel     Status: Abnormal   Collection Time: 10/15/14  9:34 AM  Result Value Ref Range   Sodium 144 137 - 147 mEq/L   Potassium 4.2 3.7 - 5.3 mEq/L   Chloride 104 96 - 112 mEq/L   CO2 22 19 - 32 mEq/L   Glucose, Bld 107 (H) 70 - 99 mg/dL   BUN 10 6 - 23 mg/dL   Creatinine, Ser 0.73 0.50 - 1.35 mg/dL   Calcium 9.0 8.4 - 10.5 mg/dL   Total Protein 7.7 6.0 - 8.3 g/dL   Albumin 4.2 3.5 - 5.2 g/dL   AST 50 (H) 0 - 37 U/L   ALT 35 0 - 53 U/L   Alkaline Phosphatase 79 39 - 117 U/L   Total Bilirubin <0.2 (L) 0.3 - 1.2 mg/dL   GFR calc non Af Amer >90 >90 mL/min   GFR calc Af Amer >90 >90 mL/min    Comment: (NOTE) The eGFR has been calculated using the CKD EPI equation. This calculation has not been validated in all clinical situations. eGFR's persistently <90 mL/min signify possible Chronic Kidney Disease.    Anion gap 18 (H) 5 - 15  Ethanol (ETOH)     Status: Abnormal   Collection Time: 10/15/14   9:34 AM  Result Value Ref Range   Alcohol, Ethyl (B) 337 (H) 0 - 11 mg/dL    Comment:        LOWEST  DETECTABLE LIMIT FOR SERUM ALCOHOL IS 11 mg/dL FOR MEDICAL PURPOSES ONLY   Salicylate level     Status: Abnormal   Collection Time: 10/15/14  9:34 AM  Result Value Ref Range   Salicylate Lvl <1.6 (L) 2.8 - 20.0 mg/dL  Testosterone     Status: None   Collection Time: 10/18/14  6:30 AM  Result Value Ref Range   Testosterone 334 300 - 890 ng/dL    Comment: (NOTE)          Tanner Stage       Male              Male              I              < 30 ng/dL        < 10 ng/dL              II             < 150 ng/dL       < 30 ng/dL              III            100-320 ng/dL     < 35 ng/dL              IV             200-970 ng/dL     15-40 ng/dL              V/Adult        300-890 ng/dL     10-70 ng/dL Performed at [facility]   Testosterone, % free     Status: Abnormal   Collection Time: 10/18/14  6:30 AM  Result Value Ref Range   Testosterone-% Free 2.4 (H) 1.6 - 2.9 %    Comment: Performed at [facility]  Testosterone, free     Status: None   Collection Time: 10/18/14  6:30 AM  Result Value Ref Range   Testosterone, Free 80.6 47.0 - 244.0 pg/mL    Comment: (NOTE) The concentration of free testosterone is derived from a mathematical expression based on constants for the binding of testosterone to sex hormone-binding globulin and albumin. Performed at [facility]   Sex hormone binding globulin     Status: None   Collection Time: 10/18/14  6:30 AM  Result Value Ref Range   Sex Hormone Binding 23 13 - 71 nmol/L    Comment: Performed at [facility]  TSH     Status: Abnormal   Collection Time: 10/18/14  6:30 AM  Result Value Ref Range   TSH 4.640 (H) 0.350 - 4.500 uIU/mL    Comment: Performed at [facility]      Constitutional:  BP 146/77 mmHg  Pulse 111  Ht 5' 7" (1.702 m)  Wt 213 lb 6.4 oz (96.798 kg)  BMI 33.42  kg/m2   Mental Status Examination;  Patient is casually dressed and fairly groomed.   he is pleasant and cooperative.  He maintained good eye contact.  He described his mood  anxious and his affect is appropriate.  His attention and concentration is fair.  He denies any auditory or visual hallucination.  He denies any active or passive suicidal thoughts or homicidal thought.  There were no paranoia, delusion or any obsessive thoughts.  His psychomotor activity is normal.  His fund of  knowledge is good.  There were no tremors, shakes or any EPS.  There were no flight of ideas or any loose association.  His cognition is good.  He is alert and oriented 3.  His insight judgment and impulse control is okay.   Established Problem, Stable/Improving (1), Review of Psycho-Social Stressors (1), Review of Last Therapy Session (1), Review of Medication Regimen & Side Effects (2) and Review of New Medication or Change in Dosage (2)  Assessment: Axis I: Major depressive disorder, recurrent.  Alcohol abuse, cannabis abuse  Axis II: Deferred  Axis III:  Past Medical History  Diagnosis Date  . Hypertension   . Hypercholesteremia   . Alcohol abuse   . Asthma      Plan:  Patient is overall doing better.  He is less anxious and less depressed but he continues to have panic attack.  He has no tremors or shakes.  He continues to attend AA meeting and despite one slip he's been sober.  I recommended to take trazodone 100 mg at bedtime every night and continue Campral 666 milligram 3 times a day, Celexa 20 mg daily and I will add low-dose Vistaril to help his anxiety and panic attack.  Encouraged to keep appointment with a therapist.  He is seeing Marvin Cooper for counseling.  We will consider increasing Wellbutrin if he continues to have anxiety symptoms.  Recommended to call us back if he has any question or any concern.  I will see him again in 2 months. Time spent 55 minutes.  More than 50% of the time spent  in psychoeducation, counseling and coordination of care.  Discuss safety plan that anytime having active suicidal thoughts or homicidal thoughts then patient need to call 911 or go to the local emergency room.    Teren Zurcher T., MD 11/28/2014

## 2015-01-27 ENCOUNTER — Ambulatory Visit (HOSPITAL_COMMUNITY): Payer: Self-pay | Admitting: Psychiatry

## 2015-02-18 ENCOUNTER — Other Ambulatory Visit (HOSPITAL_COMMUNITY): Payer: Self-pay | Admitting: Psychiatry

## 2015-02-18 NOTE — Telephone Encounter (Signed)
Called patient to schedule appointment for medication refill.  Patient Marvin Cooper appointment in March.  Patient stated Dr. Lolly MustacheArfeen is no longer needed, can get medication through his PCP.

## 2015-03-10 ENCOUNTER — Other Ambulatory Visit (HOSPITAL_COMMUNITY): Payer: Self-pay | Admitting: Psychiatry

## 2015-06-29 ENCOUNTER — Observation Stay (HOSPITAL_COMMUNITY)
Admission: EM | Admit: 2015-06-29 | Discharge: 2015-07-01 | Disposition: A | Discharge status: Home / Self Care | Payer: BLUE CROSS/BLUE SHIELD | Attending: Internal Medicine | Admitting: Internal Medicine

## 2015-06-29 ENCOUNTER — Encounter (HOSPITAL_COMMUNITY): Payer: Self-pay | Admitting: Emergency Medicine

## 2015-06-29 DIAGNOSIS — F10239 Alcohol dependence with withdrawal, unspecified: Secondary | ICD-10-CM

## 2015-06-29 DIAGNOSIS — R569 Unspecified convulsions: Secondary | ICD-10-CM | POA: Diagnosis present

## 2015-06-29 DIAGNOSIS — F1020 Alcohol dependence, uncomplicated: Secondary | ICD-10-CM | POA: Diagnosis not present

## 2015-06-29 DIAGNOSIS — R000 Tachycardia, unspecified: Secondary | ICD-10-CM | POA: Insufficient documentation

## 2015-06-29 DIAGNOSIS — X58XXXA Exposure to other specified factors, initial encounter: Secondary | ICD-10-CM | POA: Diagnosis not present

## 2015-06-29 DIAGNOSIS — J45909 Unspecified asthma, uncomplicated: Secondary | ICD-10-CM | POA: Diagnosis not present

## 2015-06-29 DIAGNOSIS — Z79899 Other long term (current) drug therapy: Secondary | ICD-10-CM | POA: Insufficient documentation

## 2015-06-29 DIAGNOSIS — F17210 Nicotine dependence, cigarettes, uncomplicated: Secondary | ICD-10-CM | POA: Insufficient documentation

## 2015-06-29 DIAGNOSIS — I10 Essential (primary) hypertension: Secondary | ICD-10-CM | POA: Diagnosis not present

## 2015-06-29 DIAGNOSIS — E780 Pure hypercholesterolemia: Secondary | ICD-10-CM | POA: Diagnosis not present

## 2015-06-29 DIAGNOSIS — F339 Major depressive disorder, recurrent, unspecified: Secondary | ICD-10-CM | POA: Insufficient documentation

## 2015-06-29 DIAGNOSIS — E785 Hyperlipidemia, unspecified: Secondary | ICD-10-CM | POA: Insufficient documentation

## 2015-06-29 DIAGNOSIS — S0101XA Laceration without foreign body of scalp, initial encounter: Secondary | ICD-10-CM | POA: Diagnosis not present

## 2015-06-29 LAB — CBC
HEMATOCRIT: 45 % (ref 39.0–52.0)
HEMOGLOBIN: 15.1 g/dL (ref 13.0–17.0)
MCH: 31.7 pg (ref 26.0–34.0)
MCHC: 33.6 g/dL (ref 30.0–36.0)
MCV: 94.5 fL (ref 78.0–100.0)
Platelets: 182 10*3/uL (ref 150–400)
RBC: 4.76 MIL/uL (ref 4.22–5.81)
RDW: 14.2 % (ref 11.5–15.5)
WBC: 8.2 10*3/uL (ref 4.0–10.5)

## 2015-06-29 LAB — BASIC METABOLIC PANEL
ANION GAP: 8 (ref 5–15)
BUN: 17 mg/dL (ref 6–20)
CHLORIDE: 104 mmol/L (ref 101–111)
CO2: 24 mmol/L (ref 22–32)
Calcium: 9.4 mg/dL (ref 8.9–10.3)
Creatinine, Ser: 0.91 mg/dL (ref 0.61–1.24)
GFR calc Af Amer: >60 mL/min (ref >60)
GFR calc non Af Amer: >60 mL/min (ref >60)
GLUCOSE: 147 mg/dL — AB (ref 65–99)
Potassium: 4.2 mmol/L (ref 3.5–5.1)
Sodium: 136 mmol/L (ref 135–145)

## 2015-06-29 LAB — CBG MONITORING, ED: Glucose-Capillary: 138 mg/dL — ABNORMAL HIGH (ref 65–99)

## 2015-06-29 NOTE — ED Provider Notes (Signed)
CSN: [id number]   Arrival date & time 06/29/15 2253  History  This chart was scribed for Marvin Kaplan, MD by Bethel Born, ED Scribe. This patient was seen in room WA05/WA05 and the patient's care was started at 11:20 PM.  Chief Complaint  Patient presents with  . Seizures  . Head Laceration    HPI The history is provided by the patient. No language interpreter was used.   Marvin Cooper is a 44 y.o. male with PMHx of alcohol abuse who presents to the Emergency Department complaining of a seizure tonight. He is being treated for alcohol abuse at [facility] and tonight he was sitting on a bench outside when he started to feel dizzy and "blacked out". He was still seizing on EMS arrival and given 10 mg of valium IM. Pt states that he may have bit his tongue. Associated symptoms include head laceration and facial abrasions. After the seizure he was briefly confused but believes that he is presently back at his baseline mental status. Pt denies bladder incontinence, chest pain, numbness, tingling, and weakness. No history of seizures. Pt abused alcohol for 30 years and has been abstinent for 5 days. In the past he has had withdrawal symptoms but not seizures in times of abstinence. At his current facility he is being treated with a tapering dose of librium and he states that today was his last dose. Last tetanus shot was 3 weeks ago.     Past Medical History  Diagnosis Date  . Hypertension   . Hypercholesteremia   . Alcohol abuse   . Asthma     Past Surgical History  Procedure Laterality Date  . Knee surgery    . Leg surgery Left Age 58    ORIF lower leg  . Adenoidectomy  Age 19  . Shoulder arthroscopy      Family History  Problem Relation Age of Onset  . Alcohol abuse Brother   . Alcohol abuse Sister   . Alcohol abuse Maternal Grandfather     Social History  Substance Use Topics  . Smoking status: Current Every Day Smoker -- 0.25 packs/day for 20 years    Types:  Cigarettes  . Smokeless tobacco: Never Used  . Alcohol Use: 48.0 oz/week    80 Cans of beer per week     Review of Systems  HENT:       Head laceration Facial abrasions  Cardiovascular: Negative for chest pain.  Neurological: Positive for seizures. Negative for weakness and numbness.     Home Medications   Prior to Admission medications   Medication Sig Start Date End Date Taking? Authorizing Provider  albuterol (PROVENTIL HFA;VENTOLIN HFA) 108 (90 BASE) MCG/ACT inhaler Inhale 2 puffs into the lungs every 4 (four) hours as needed for wheezing or shortness of breath. 10/18/14  Yes Sanjuana Kava, NP  benazepril (LOTENSIN) 40 MG tablet Take 1 tablet (40 mg total) by mouth daily. For high blood pressure control 10/18/14  Yes Sanjuana Kava, NP  buPROPion (WELLBUTRIN XL) 300 MG 24 hr tablet Take 1 tablet (300 mg total) by mouth daily. For depression 11/28/14  Yes Cleotis Nipper, MD  citalopram (CELEXA) 20 MG tablet Take 1 tablet (20 mg total) by mouth daily. For depression 11/28/14  Yes Cleotis Nipper, MD  clonazePAM (KLONOPIN) 2 MG tablet Take 2 mg by mouth 2 (two) times daily.   Yes Historical Provider, MD  Fenofibrate 150 MG CAPS Take 1 capsule (150 mg total) by  mouth daily. For cholesterol control 10/18/14  Yes Sanjuana Kava, NP  furosemide (LASIX) 40 MG tablet Take 40 mg by mouth daily.   Yes Historical Provider, MD  loratadine (CLARITIN) 10 MG tablet Take 1 tablet (10 mg total) by mouth daily. For allergies 10/18/14  Yes Sanjuana Kava, NP  niacin (NIASPAN) 500 MG CR tablet Take 500 mg by mouth at bedtime.  10/21/14  Yes Historical Provider, MD  omega-3 acid ethyl esters (LOVAZA) 1 G capsule Take 1 g by mouth 2 (two) times daily.  10/21/14  Yes Historical Provider, MD  simvastatin (ZOCOR) 40 MG tablet Take 1 tablet (40 mg total) by mouth daily. For high cholesterol control 10/18/14  Yes Sanjuana Kava, NP  traZODone (DESYREL) 100 MG tablet Take 1 tablet (100 mg total) by mouth at bedtime and may  repeat dose one time if needed. For sleep Patient taking differently: Take 100 mg by mouth at bedtime as needed. For sleep 11/28/14  Yes Cleotis Nipper, MD  hydrOXYzine (VISTARIL) 25 MG capsule Take 1 capsule (25 mg total) by mouth as needed for anxiety. Patient not taking: Reported on 06/30/2015 11/28/14   Cleotis Nipper, MD    Allergies  Review of patient's allergies indicates no known allergies.  Triage Vitals: BP 126/69 mmHg  Pulse 113  Temp(Src) 98.3 F (36.8 C) (Oral)  Resp 21  SpO2 93%  Physical Exam  Constitutional: He is oriented to person, place, and time. He appears well-developed and well-nourished.  HENT:  Head: Normocephalic.  3 cm laceration to forehead with no active bleeding Left side of the forehead with ecchymosis, abrasion, and swelling 2 cm abrasion over the nasal bridge No septal hematoma No active nasal bleeding  Eyes: EOM are normal.  Neck: Normal range of motion.  Cardiovascular: Normal rate and regular rhythm.   Pulmonary/Chest: Effort normal.  Lungs CTAB  Abdominal: Soft. He exhibits no distension. There is no tenderness.  Musculoskeletal: Normal range of motion.  Neurological: He is alert and oriented to person, place, and time.  Psychiatric: He has a normal mood and affect.  Nursing note and vitals reviewed.   ED Course  Procedures   DIAGNOSTIC STUDIES: Oxygen Saturation is 93% on RA, normal by my interpretation.    COORDINATION OF CARE: 11:27 PM Discussed treatment plan which includes lab work and laceration repair with pt at bedside and pt agreed to plan.  I, Marvin Kaplan, MD, personally reviewed and evaluated these images and lab results as part of my medical decision-making.   Labs Review-  Labs Reviewed  BASIC METABOLIC PANEL - Abnormal; Notable for the following:    Glucose, Bld 147 (*)    All other components within normal limits  CBC WITH DIFFERENTIAL/PLATELET - Abnormal; Notable for the following:    Monocytes Relative 13 (*)     Monocytes Absolute 1.3 (*)    All other components within normal limits  COMPREHENSIVE METABOLIC PANEL - Abnormal; Notable for the following:    Glucose, Bld 123 (*)    AST 44 (*)    Anion gap 4 (*)    All other components within normal limits  CBG MONITORING, ED - Abnormal; Notable for the following:    Glucose-Capillary 138 (*)    All other components within normal limits  CBC  PROTIME-INR    Imaging Review No results found.  EKG Interpretation  Date/Time:  Tuesday June 30 2015 00:50:45 EDT Ventricular Rate:  103 PR Interval:  140 QRS Duration: 103  QT Interval:  346 QTC Calculation: 453 R Axis:   50 Text Interpretation:  Sinus tachycardia Confirmed by [facility] MD, Barbara Cower [phone]) on 06/30/2015 12:54:44 AM       MDM   Final diagnoses:  Seizure  Alcohol dependence with withdrawal with complication    I personally performed the services described in this documentation, which was scribed in my presence. The recorded information has been reviewed and is accurate.  Pt with witnessed seizures. He is undergoing alcohol detox, and has been there for the last 5 days. Lac to the scalp repaired. No clinical features, besides seizures - suggestive of DTs. Will admit however for obs, as there is no hx of seizures, and the facility requires medical clearance before he can be accepted, which is hard to do in this circumstance with few hours of data in the er.   Marvin Kaplan, MD 07/28/15 2238

## 2015-06-29 NOTE — ED Notes (Addendum)
Spoke to [employer] from [employer].  She reports pt is at the facility for etoh detox x 5 days.  Had a seizure episode while sitting down, fell face down hitting his forehead.  Received Valium  IM prior to EMS arrival.  No hx of seizures.  Inetta Fermo states pt can be discharged back to [facility] when medically cleared but needs info faxed to [phone] first and with approval from them.  Call [phone]

## 2015-06-29 NOTE — ED Notes (Signed)
Pt from [facility] via EMS. He was outside with some peers and had a seizure and fell hitting his head. By the time staff arrived, he was still having a seizure. He has been there for 5 days detoxing from alcohol.    Pt was given 10 mg IM valium at [facility].

## 2015-06-29 NOTE — ED Notes (Signed)
Nurse drawing labs. 

## 2015-06-30 ENCOUNTER — Emergency Department (HOSPITAL_COMMUNITY): Payer: BLUE CROSS/BLUE SHIELD

## 2015-06-30 ENCOUNTER — Encounter (HOSPITAL_COMMUNITY): Payer: Self-pay | Admitting: Internal Medicine

## 2015-06-30 ENCOUNTER — Observation Stay (HOSPITAL_COMMUNITY)
Admit: 2015-06-30 | Discharge: 2015-06-30 | Disposition: A | Discharge status: Home / Self Care | Payer: BLUE CROSS/BLUE SHIELD | Attending: Internal Medicine | Admitting: Internal Medicine

## 2015-06-30 DIAGNOSIS — S0101XA Laceration without foreign body of scalp, initial encounter: Secondary | ICD-10-CM

## 2015-06-30 DIAGNOSIS — R569 Unspecified convulsions: Secondary | ICD-10-CM

## 2015-06-30 DIAGNOSIS — F1024 Alcohol dependence with alcohol-induced mood disorder: Secondary | ICD-10-CM

## 2015-06-30 DIAGNOSIS — F3340 Major depressive disorder, recurrent, in remission, unspecified: Secondary | ICD-10-CM | POA: Diagnosis not present

## 2015-06-30 LAB — COMPREHENSIVE METABOLIC PANEL
ALBUMIN: 4.3 g/dL (ref 3.5–5.0)
ALK PHOS: 62 U/L (ref 38–126)
ALT: 34 U/L (ref 17–63)
AST: 44 U/L — AB (ref 15–41)
Anion gap: 4 — ABNORMAL LOW (ref 5–15)
BILIRUBIN TOTAL: 0.3 mg/dL (ref 0.3–1.2)
BUN: 15 mg/dL (ref 6–20)
CALCIUM: 9.1 mg/dL (ref 8.9–10.3)
CO2: 30 mmol/L (ref 22–32)
Chloride: 106 mmol/L (ref 101–111)
Creatinine, Ser: 0.8 mg/dL (ref 0.61–1.24)
GFR calc Af Amer: >60 mL/min (ref >60)
GLUCOSE: 123 mg/dL — AB (ref 65–99)
Potassium: 3.7 mmol/L (ref 3.5–5.1)
Sodium: 140 mmol/L (ref 135–145)
TOTAL PROTEIN: 6.8 g/dL (ref 6.5–8.1)

## 2015-06-30 LAB — CBC WITH DIFFERENTIAL/PLATELET
BASOS ABS: 0 10*3/uL (ref 0.0–0.1)
BASOS PCT: 0 % (ref 0–1)
Eosinophils Absolute: 0.3 10*3/uL (ref 0.0–0.7)
Eosinophils Relative: 3 % (ref 0–5)
HEMATOCRIT: 46.2 % (ref 39.0–52.0)
HEMOGLOBIN: 15.1 g/dL (ref 13.0–17.0)
Lymphocytes Relative: 30 % (ref 12–46)
Lymphs Abs: 2.9 10*3/uL (ref 0.7–4.0)
MCH: 31.4 pg (ref 26.0–34.0)
MCHC: 32.7 g/dL (ref 30.0–36.0)
MCV: 96 fL (ref 78.0–100.0)
MONO ABS: 1.3 10*3/uL — AB (ref 0.1–1.0)
MONOS PCT: 13 % — AB (ref 3–12)
Neutro Abs: 5.3 10*3/uL (ref 1.7–7.7)
Neutrophils Relative %: 54 % (ref 43–77)
Platelets: 197 10*3/uL (ref 150–400)
RBC: 4.81 MIL/uL (ref 4.22–5.81)
RDW: 14.5 % (ref 11.5–15.5)
WBC: 9.8 10*3/uL (ref 4.0–10.5)

## 2015-06-30 LAB — PROTIME-INR
INR: 0.97 (ref 0.00–1.49)
PROTHROMBIN TIME: 13.1 s (ref 11.6–15.2)

## 2015-06-30 MED ORDER — ONDANSETRON HCL 4 MG PO TABS: 4.0000 mg | ORAL_TABLET | Freq: Four times a day (QID) | ORAL | Status: DC | PRN

## 2015-06-30 MED ORDER — ALBUTEROL SULFATE (2.5 MG/3ML) 0.083% IN NEBU: 2.5000 mg | INHALATION_SOLUTION | RESPIRATORY_TRACT | Status: DC | PRN

## 2015-06-30 MED ORDER — ADULT MULTIVITAMIN W/MINERALS CH
1.0000 | ORAL_TABLET | Freq: Every day | ORAL | Status: DC
  Administered 2015-06-30 – 2015-07-01 (×2): 1 via ORAL
  Filled 2015-06-30 (×2): qty 1

## 2015-06-30 MED ORDER — SIMVASTATIN 40 MG PO TABS
40.0000 mg | ORAL_TABLET | Freq: Every day | ORAL | Status: DC
  Administered 2015-06-30 – 2015-07-01 (×2): 40 mg via ORAL
  Filled 2015-06-30 (×3): qty 1

## 2015-06-30 MED ORDER — VITAMIN B-1 100 MG PO TABS
100.0000 mg | ORAL_TABLET | Freq: Every day | ORAL | Status: DC
  Administered 2015-06-30 – 2015-07-01 (×2): 100 mg via ORAL
  Filled 2015-06-30 (×3): qty 1

## 2015-06-30 MED ORDER — ACETAMINOPHEN 325 MG PO TABS: 650.0000 mg | ORAL_TABLET | Freq: Four times a day (QID) | ORAL | Status: DC | PRN

## 2015-06-30 MED ORDER — LORAZEPAM 1 MG PO TABS
1.0000 mg | ORAL_TABLET | Freq: Four times a day (QID) | ORAL | Status: DC | PRN
  Administered 2015-06-30 – 2015-07-01 (×4): 1 mg via ORAL
  Filled 2015-06-30 (×4): qty 1

## 2015-06-30 MED ORDER — LORAZEPAM 2 MG/ML IJ SOLN: 1.0000 mg | INTRAMUSCULAR | Status: DC | PRN

## 2015-06-30 MED ORDER — BUPROPION HCL ER (XL) 300 MG PO TB24
300.0000 mg | ORAL_TABLET | Freq: Every day | ORAL | Status: DC
  Administered 2015-06-30 – 2015-07-01 (×2): 300 mg via ORAL
  Filled 2015-06-30 (×3): qty 1

## 2015-06-30 MED ORDER — BENAZEPRIL HCL 40 MG PO TABS
40.0000 mg | ORAL_TABLET | Freq: Every day | ORAL | Status: DC
  Administered 2015-06-30 – 2015-07-01 (×2): 40 mg via ORAL
  Filled 2015-06-30 (×3): qty 1

## 2015-06-30 MED ORDER — ACETAMINOPHEN 650 MG RE SUPP: 650.0000 mg | Freq: Four times a day (QID) | RECTAL | Status: DC | PRN

## 2015-06-30 MED ORDER — TRAZODONE HCL 50 MG PO TABS: 100.0000 mg | ORAL_TABLET | Freq: Every evening | ORAL | Status: DC | PRN

## 2015-06-30 MED ORDER — CITALOPRAM HYDROBROMIDE 20 MG PO TABS
20.0000 mg | ORAL_TABLET | Freq: Every day | ORAL | Status: DC
  Administered 2015-06-30 – 2015-07-01 (×2): 20 mg via ORAL
  Filled 2015-06-30 (×3): qty 1

## 2015-06-30 MED ORDER — LORAZEPAM 2 MG/ML IJ SOLN: 1.0000 mg | Freq: Four times a day (QID) | INTRAMUSCULAR | Status: DC | PRN

## 2015-06-30 MED ORDER — LIDOCAINE-EPINEPHRINE 1 %-1:100000 IJ SOLN
10.0000 mL | Freq: Once | INTRAMUSCULAR | Status: AC
  Administered 2015-06-30: 10 mL via INTRADERMAL
  Filled 2015-06-30: qty 1

## 2015-06-30 MED ORDER — FOLIC ACID 1 MG PO TABS
1.0000 mg | ORAL_TABLET | Freq: Every day | ORAL | Status: DC
  Administered 2015-06-30 – 2015-07-01 (×2): 1 mg via ORAL
  Filled 2015-06-30 (×2): qty 1

## 2015-06-30 MED ORDER — ONDANSETRON HCL 4 MG/2ML IJ SOLN: 4.0000 mg | Freq: Four times a day (QID) | INTRAMUSCULAR | Status: DC | PRN

## 2015-06-30 MED ORDER — ALBUTEROL SULFATE HFA 108 (90 BASE) MCG/ACT IN AERS: 2.0000 | INHALATION_SPRAY | RESPIRATORY_TRACT | Status: DC | PRN

## 2015-06-30 NOTE — Procedures (Signed)
ELECTROENCEPHALOGRAM REPORT   Patient: Marvin Cooper      Room #: 4098 [facility] Age: 44 y.o.        Sex: male Referring Physician: Dr Catha Gosselin Report Date:  06/30/2015        Interpreting Physician: Omelia Blackwater  History: Marvin Cooper is an 44 y.o. male hx of EtOH abuse admitted with question of seizure activity  Medications:  Scheduled: . benazepril  40 mg Oral Daily  . buPROPion  300 mg Oral Daily  . citalopram  20 mg Oral Daily  . folic acid  1 mg Oral Daily  . multivitamin with minerals  1 tablet Oral Daily  . simvastatin  40 mg Oral Daily  . thiamine  100 mg Oral Daily    Conditions of Recording:  This is a 19 channel EEG carried out with the patient in the drowsy state.  Description:  The waking background activity consists of a low voltage, symmetrical, fairly well organized, 8 to 10 Hz alpha activity, seen from the parieto-occipital and posterior temporal regions.  No focal slowing or epileptiform activity is noted.  The patient drowses with slowing to irregular, low voltage theta and beta activity. The patient goes in to a light sleep with symmetrical sleep spindles, vertex central sharp transients and irregular slow activity.    Hyperventilation was not performed. Intermittent photic stimulation was performed but failed to illicit any change in the tracing.    IMPRESSION: Normal electroencephalogram, awake, asleep and with activation procedures. There are no focal lateralizing or epileptiform features.   Elspeth Cho, DO [facility] [phone]  If 7pm- 7am, please page neurology on call as listed in AMION. 06/30/2015, 5:46 PM

## 2015-06-30 NOTE — Progress Notes (Signed)
Patient admitted earlier this morning by Dr. Lynden Oxford.  Pending H&P.    In summary this is a 44 year old male with a history of alcohol abuse for approximately 30 years presented to the emergency department from [facility] after questionable seizure. Patient states that prior to the seizure he had felt dizzy and felt that he was slurring his speech. He then blacked out. Upon arrival to emergency department patient was seizing and was given 10 mg of Valium. CT of the head: Left frontal scalp hematoma, no skull fracture no acute intercranial process. EEG is currently pending. Given that this may be patient's first seizure, may not warrant medications. Not sure of seizure was due to alcohol withdrawal as patient had been at [facility] for approximately 1 week.    Patient seen and examined. Currently stable.  Time spent: 20 minutes  Lennie Vasco D.O. [facility] Pager [phone]  If 7PM-7AM, please contact night-coverage [email or web address] Password [email or web address] 06/30/2015, 1:00 PM

## 2015-06-30 NOTE — ED Provider Notes (Signed)
Dr. Rhunette Croft asking me to suture pt's laceration  Physical Exam  BP 126/69 mmHg  Pulse 113  Temp(Src) 98.3 F (36.8 C) (Oral)  Resp 21  SpO2 93%  Physical Exam Skin: approx 3cm linear lac to L forehead, superficial, bleeding controlled with pressure.   ED Course  LACERATION REPAIR Date/Time: 06/30/2015 12:40 AM Performed by: Allen Derry Authorized by: Allen Derry Consent: Verbal consent obtained. Risks and benefits: risks, benefits and alternatives were discussed Consent given by: patient Patient understanding: patient states understanding of the procedure being performed Patient consent: the patient's understanding of the procedure matches consent given Patient identity confirmed: verbally with patient Body area: head/neck Location details: forehead Laceration length: 3 cm Foreign bodies: no foreign bodies Tendon involvement: none Nerve involvement: none Vascular damage: no Anesthesia: local infiltration Local anesthetic: lidocaine 1% with epinephrine Anesthetic total: 2 ml Patient sedated: no Preparation: Patient was prepped and draped in the usual sterile fashion. Irrigation solution: saline Irrigation method: syringe Amount of cleaning: standard Debridement: none Degree of undermining: none Skin closure: 5-0 Prolene Number of sutures: 3 Technique: simple Approximation: close Approximation difficulty: simple Dressing: 4x4 sterile gauze and antibiotic ointment Patient tolerance: Patient tolerated the procedure well with no immediate complications     Cyanne Delmar Camprubi-Soms, PA-C 06/30/15 0100  [name], MD 07/02/15 1012

## 2015-06-30 NOTE — H&P (Signed)
[facility] History and Physical  Patient: Marvin Cooper  MRN: [id number]  DOB: 1971-01-28  DOS: the patient was seen and examined on 06/30/2015 PCP: Ethel Rana  Referring physician: Dr Rhunette Croft Chief Complaint: Seizure like activity  HPI: Marvin Cooper is a 44 y.o. male with Past medical history of hypertension, alcohol abuse, dyslipidemia, mood disorder. The pt presented with seizure like activity. Pt has h/o heavy alcohol use and [facility] Pt was there for 5 days and has completed the course of librium on 06/29/15. In the evening of 06/29/15, the pt was with his peers and suddenly felt dizzy. He felt his head was shaking and than he does not remember anything. The next thing he remember is members of the [facility] standing around him with EMS. He was not confuse but he does not know how long he was unconscious. Pt was witnessed to have seizure like activity by bystanders. but pt denies having any tongue bite or loss of control of bowel or bladder. Pt mentions that he takes 2 mg klonopin twice a day and he was not getting it in the [facility]. Pt denies any fever, chills, headache, runny nose, neck pain, choking episodes, cough, rash anywhere,  chest pain, palpitation, shortness of breath, orthopnea, PND,  nausea, vomiting, diarrhea, constipation, abdominal pain,  burning urination, increase urinary frequency,  difficulty swallowing, focal neurological deficit. He also mentions that he takes 40 mg lasix daily adn they have increase it at the [facility].  The patient is coming from home.  At his baseline ambulates without any support. And is independent for most of his ADL manages her medication on his own.  Review of Systems: as mentioned in the history of present illness.  A comprehensive review of the other systems is negative.  Past Medical History  Diagnosis Date  . Hypertension   . Hypercholesteremia   .  Alcohol abuse   . Asthma    Past Surgical History  Procedure Laterality Date  . Knee surgery    . Leg surgery Left Age 21    ORIF lower leg  . Adenoidectomy  Age 4  . Shoulder arthroscopy     Social History:  reports that he has been smoking Cigarettes.  He has a 5 pack-year smoking history. He has never used smokeless tobacco. He reports that he drinks about 48.0 oz of alcohol per week. He reports that he uses illicit drugs (Marijuana).  No Known Allergies  Family History  Problem Relation Age of Onset  . Alcohol abuse Brother   . Alcohol abuse Sister   . Alcohol abuse Maternal Grandfather     Prior to Admission medications   Medication Sig Start Date End Date Taking? Authorizing Provider  albuterol (PROVENTIL HFA;VENTOLIN HFA) 108 (90 BASE) MCG/ACT inhaler Inhale 2 puffs into the lungs every 4 (four) hours as needed for wheezing or shortness of breath. 10/18/14  Yes Sanjuana Kava, NP  benazepril (LOTENSIN) 40 MG tablet Take 1 tablet (40 mg total) by mouth daily. For high blood pressure control 10/18/14  Yes Sanjuana Kava, NP  buPROPion (WELLBUTRIN XL) 300 MG 24 hr tablet Take 1 tablet (300 mg total) by mouth daily. For depression 11/28/14  Yes Cleotis Nipper, MD  citalopram (CELEXA) 20 MG tablet Take 1 tablet (20 mg total) by mouth daily. For depression 11/28/14  Yes Cleotis Nipper, MD  clonazePAM (KLONOPIN) 2 MG tablet Take 2 mg by mouth 2 (  two) times daily.   Yes Historical Provider, MD  Fenofibrate 150 MG CAPS Take 1 capsule (150 mg total) by mouth daily. For cholesterol control 10/18/14  Yes Sanjuana Kava, NP  furosemide (LASIX) 40 MG tablet Take 40 mg by mouth daily.   Yes Historical Provider, MD  loratadine (CLARITIN) 10 MG tablet Take 1 tablet (10 mg total) by mouth daily. For allergies 10/18/14  Yes Sanjuana Kava, NP  niacin (NIASPAN) 500 MG CR tablet Take 500 mg by mouth at bedtime.  10/21/14  Yes Historical Provider, MD  omega-3 acid ethyl esters (LOVAZA) 1 G capsule Take 1 g by  mouth 2 (two) times daily.  10/21/14  Yes Historical Provider, MD  simvastatin (ZOCOR) 40 MG tablet Take 1 tablet (40 mg total) by mouth daily. For high cholesterol control 10/18/14  Yes Sanjuana Kava, NP  traZODone (DESYREL) 100 MG tablet Take 1 tablet (100 mg total) by mouth at bedtime and may repeat dose one time if needed. For sleep Patient taking differently: Take 100 mg by mouth at bedtime as needed. For sleep 11/28/14  Yes Cleotis Nipper, MD  acamprosate (CAMPRAL) 333 MG tablet Take 2 tablets (666 mg total) by mouth 3 (three) times daily with meals. For alcohol dependence Patient not taking: Reported on 06/30/2015 11/28/14   Cleotis Nipper, MD  hydrOXYzine (VISTARIL) 25 MG capsule Take 1 capsule (25 mg total) by mouth as needed for anxiety. Patient not taking: Reported on 06/30/2015 11/28/14   Cleotis Nipper, MD    Physical Exam: Filed Vitals:   06/30/15 0340 06/30/15 0400 06/30/15 0647 06/30/15 1455  BP: 148/81  141/89 139/81  Pulse: 90  88 85  Temp: 97.9 F (36.6 C)  97.5 F (36.4 C) 98.2 F (36.8 C)  TempSrc: Oral  Oral Oral  Resp: 20  20 19   Height:  5\' 7"  (1.702 m)    Weight:  100.699 kg (222 lb)    SpO2: 98%  99% 96%    General: Alert, Awake and Oriented to Time, Place and Person. Appear in mild distress Front scalp laceration sutured.  Eyes: PERRL, no pain while eye movement. ENT: Oral Mucosa clear moist. Neck: no JVD Cardiovascular: S1 and S2 Present, no Murmur, Peripheral Pulses Present Respiratory: Bilateral Air entry equal and Decreased,  Clear to Auscultation, no Crackles, no wheezes Abdomen: Bowel Sound present, Soft and no tenderness Skin: no Rash Extremities: bilateral Pedal edema, no calf tenderness Neurologic: Mental status AAOx3, speech normal, attention normal,  Cranial Nerves PERRL, EOM normal and present, facial sensation to light touch present,  Motor strength bilateral equal strength 5/5,  Sensation present to light touch,  Reflexes present knee and  biceps, babinski negative,  Cerebellar test normal finger nose finger.  Labs on Admission:  CBC:  Recent Labs Lab 06/29/15 2317 06/30/15 0655  WBC 8.2 9.8  NEUTROABS  --  5.3  HGB 15.1 15.1  HCT 45.0 46.2  MCV 94.5 96.0  PLT 182 197    CMP     Component Value Date/Time   NA 140 06/30/2015 0655   K 3.7 06/30/2015 0655   CL 106 06/30/2015 0655   CO2 30 06/30/2015 0655   GLUCOSE 123* 06/30/2015 0655   BUN 15 06/30/2015 0655   CREATININE 0.80 06/30/2015 0655   CALCIUM 9.1 06/30/2015 0655   PROT 6.8 06/30/2015 0655   ALBUMIN 4.3 06/30/2015 0655   AST 44* 06/30/2015 0655   ALT 34 06/30/2015 0655   ALKPHOS 62  06/30/2015 0655   BILITOT 0.3 06/30/2015 0655   GFRNONAA >60 06/30/2015 0655   GFRAA >60 06/30/2015 0655    No results for input(s): LIPASE, AMYLASE in the last 168 hours.  No results for input(s): CKTOTAL, CKMB, CKMBINDEX, TROPONINI in the last 168 hours. BNP (last 3 results) No results for input(s): BNP in the last 8760 hours.  ProBNP (last 3 results) No results for input(s): PROBNP in the last 8760 hours.   Radiological Exams on Admission: Ct Head Wo Contrast  06/30/2015   CLINICAL DATA:  Seizure episode, on treatment for alcohol abuse. Head laceration, facial laceration. History of hypertension and hypercholesterolemia.  EXAM: CT HEAD WITHOUT CONTRAST  TECHNIQUE: Contiguous axial images were obtained from the base of the skull through the vertex without intravenous contrast.  COMPARISON:  None.  FINDINGS: Mild prominence of cerebellar folia range. Ventricles are normal. No intraparenchymal hemorrhage, mass effect nor midline shift. No acute large vascular territory infarcts.  No abnormal extra-axial fluid collections. Basal cisterns are patent. Minimal calcific atherosclerosis of the carotid siphons.  Moderate LEFT frontal scalp hematoma without subcutaneous gas or radiopaque foreign bodies. No skull fracture. The included ocular globes and orbital contents are  non-suspicious. Trace paranasal sinus mucosal thickening without air-fluid levels. The mastoid air cells are well aerated.  IMPRESSION: Moderate LEFT frontal scalp hematoma.  No skull fracture.  No acute intracranial process.  Mild cerebellar volume loss for age.   Electronically Signed   By: Awilda Metro M.D.   On: 06/30/2015 01:18   EKG: Independently reviewed. sinus tachycardia.  Assessment/Plan Principal Problem:   Seizure-like activity Active Problems:   Alcohol dependence   Major depression, recurrent   Scalp laceration   1. Seizure-like activity The pt has been presenting with seizure like activity. He does not have any tongue bite or loss of control of bowel or bladder. Not he had any prolonged post ictal period. He does have a front scalp hematoma. At present neurology on phone has been consulted and recommend to get an EEG for the pt. Pt will be admitted and placed on seizure prophylaxis. My suspicion for seizure is less likely. Serial neurochecks.  2.scalp laceration Sutured in ER. At present no active bleeding. Will monitor. CT head without any fracture or intracranial injury.  3. Hypertension  Holding home medication as this could potentially be a syncopal event.  Monitoring the patient on telemetry. May need Echo.  4. Alcohol abuse. No tremors and no confusion. Does not appear to have DT at present. Will monitor on tele with CIWA protocol. Oral thiamine and focil acid.  5.mood disorder. Continuing home medication at present.  Advance goals of care discussion: full code   Consults: ED discussed with Dr Cyril Mourning from neurology.  DVT Prophylaxis: mechanical compression device  Nutrition: regular diet  Disposition: Admitted as observation, telemetry unit.  Author: Lynden Oxford, MD [facility] Pager: [phone] 06/30/2015  If 7PM-7AM, please contact night-coverage [email or web address] Password [email or web address]

## 2015-06-30 NOTE — Discharge Instructions (Signed)
Keep wound clean with mild soap and water. Keep area covered with a topical antibiotic ointment and bandage, keep bandage dry, and do not submerge in water for 24 hours. Ice area for additional pain relief and swelling. Follow up with your primary care doctor or the [facility] in approximately 7-10 days for wound recheck and suture removal. Monitor area for signs of infection to include, but not limited to: increasing pain, redness, drainage/pus, or swelling. Return to emergency department for emergent changing or worsening symptoms.

## 2015-06-30 NOTE — Progress Notes (Signed)
EEG completed, results pending. 

## 2015-06-30 NOTE — Clinical Social Work Note (Signed)
Clinical Social Work Assessment  Patient Details  Name: Marvin Cooper MRN: [id number] Date of Birth: 1971/05/25  Date of referral:  06/30/15               Reason for consult:  Discharge Planning                Permission sought to share information with:  Other Permission granted to share information::  Yes, Release of Information Signed  Name::     Katharine Look  Agency::  [facility]  Relationship::  [place] [facility]  Contact Information:  [phone]  Housing/Transportation Living arrangements for the past 2 months:  [place] of Information:  Patient Patient Interpreter Needed:  None Criminal Activity/Legal Involvement Pertinent to Current Situation/Hospitalization:  No - Comment as needed Significant Relationships:  Spouse Lives with:  Spouse Do you feel safe going back to the place where you live?  Yes Need for family participation in patient care:  No (Coment)  Care giving concerns:  None reported   Facilities manager / plan:  CSW received referral due to patient being admitted from [facility]. CSW met with patient at bedside. Patient reports he went to [facility] 5 days ago for detox/residential treatment. Patient had a seizure at facility and was transferred to [facility]. Patient reports he has already spoken with facility and will return when medically stable to complete treatment. Patient signed ROI form which was placed in the chart.  CSW spoke with Katharine Look at facility [phone]) who reports that patient can return at [place] but that DC summary would need to be faxed to [phone] attn: Katharine Look prior to DC. CSW will continue to follow.  Employment status:  [employer] information:  Managed Care PT Recommendations:  Not assessed at this time Information / Referral to community resources:  Residential Substance Abuse Treatment Options  Patient/Family's Response to care:  Patient engaged during assessment and thanked CSW for  assistance.  Patient/Family's Understanding of and Emotional Response to Diagnosis, Current Treatment, and Prognosis:  Patient reports he is frustrated that he was admitted to the hospital. Patient reports that treatment was going well and this is "a bump in the road". Patient is hopeful to recover quickly so he can return to [place] as soon as possible.  Emotional Assessment Appearance:  Appears stated age Attitude/Demeanor/Rapport:  Other (Cooperative) Affect (typically observed):  Appropriate Orientation:  Oriented to Self, Oriented to Place, Oriented to  Time, Oriented to Situation Alcohol / Substance use:  Alcohol Use Psych involvement (Current and /or in the community):  No (Comment)  Discharge Needs  Concerns to be addressed:  No discharge needs identified Readmission within the last 30 days:  No Current discharge risk:  None Barriers to Discharge:  No Barriers Identified   Boone Master, Kay 06/30/2015, 11:53 AM [phone]

## 2015-07-01 DIAGNOSIS — R569 Unspecified convulsions: Secondary | ICD-10-CM

## 2015-07-01 NOTE — Discharge Summary (Signed)
Physician Discharge Summary  Marvin Cooper WUJ:[id number] DOB: Sep 08, 1971 DOA: 06/29/2015  PCP: Ethel Rana  Admit date: 06/29/2015 Discharge date: 07/01/2015  Recommendations for Outpatient Follow-up:  1. Pt will need to follow up with PCP in 2-3 weeks post discharge 2. Please obtain BMP to evaluate electrolytes and kidney function 3. Please also check CBC to evaluate Hg and Hct levels  Discharge Diagnoses:  Principal Problem:   Seizure-like activity Active Problems:   Alcohol dependence   Major depression, recurrent   Scalp laceration  Discharge Condition: Stable  Diet recommendation: Heart healthy diet discussed in details   History of present illness:  44 y.o. male with hypertension, alcohol abuse, dyslipidemia, mood disorder presented to [facility] ED for evaluation of seizure like activity. Pt has h/o heavy alcohol use and [facility]. Pt was there for 5 days and has completed the course of librium on 06/29/15. In the evening of 06/29/15, the pt was with his peers and suddenly felt dizzy. He felt his head was shaking and than he does not remember anything. The next thing he remember is members of the [facility] standing around him with EMS. No reported tongue bite or urinary/fecal incontinence.  Hospital Course:  Principal Problem:   Seizure-like activity - CT head with no source of seizure activity - EEG also unremarkable - pt feels better this AM and wants to be discharged - no need for AED at this time unless second event occurs - pt advised to follow up with PCP In 2 weeks  Active Problems:   Alcohol dependence - no signs of withdrawal since admission - [facility]    Major depression, recurrent - stable this MA    Scalp laceration - no bleeding, still tender at the site but pt says its much better    Procedures/Studies: Ct Head Wo Contrast  06/30/2015   CLINICAL DATA:  Seizure episode, on  treatment for alcohol abuse. Head laceration, facial laceration. History of hypertension and hypercholesterolemia.  EXAM: CT HEAD WITHOUT CONTRAST  TECHNIQUE: Contiguous axial images were obtained from the base of the skull through the vertex without intravenous contrast.  COMPARISON:  None.  FINDINGS: Mild prominence of cerebellar folia range. Ventricles are normal. No intraparenchymal hemorrhage, mass effect nor midline shift. No acute large vascular territory infarcts.  No abnormal extra-axial fluid collections. Basal cisterns are patent. Minimal calcific atherosclerosis of the carotid siphons.  Moderate LEFT frontal scalp hematoma without subcutaneous gas or radiopaque foreign bodies. No skull fracture. The included ocular globes and orbital contents are non-suspicious. Trace paranasal sinus mucosal thickening without air-fluid levels. The mastoid air cells are well aerated.  IMPRESSION: Moderate LEFT frontal scalp hematoma.  No skull fracture.  No acute intracranial process.  Mild cerebellar volume loss for age.   Electronically Signed   By: Awilda Metro M.D.   On: 06/30/2015 01:18    Discharge Exam: Filed Vitals:   07/01/15 0611  BP: 127/81  Pulse: 80  Temp: 97.1 F (36.2 C)  Resp: 20   Filed Vitals:   06/30/15 0647 06/30/15 1455 06/30/15 2356 07/01/15 0611  BP: 141/89 139/81 135/91 127/81  Pulse: 88 85 83 80  Temp: 97.5 F (36.4 C) 98.2 F (36.8 C) 97.4 F (36.3 C) 97.1 F (36.2 C)  TempSrc: Oral Oral Oral Oral  Resp: Height:      Weight:    101.787 kg (224 lb 6.4 oz)  SpO2:  99% 96% 99% 99%    General: Pt is alert, follows commands appropriately, not in acute distress Cardiovascular: Regular rate and rhythm, S1/S2 +, no murmurs, no rubs, no gallops Respiratory: Clear to auscultation bilaterally, no wheezing, no crackles, no rhonchi Abdominal: Soft, non tender, non distended, bowel sounds +, no guarding Extremities: no edema, no cyanosis, pulses palpable  bilaterally DP and PT Neuro: Grossly nonfocal  Discharge Instructions  Discharge Instructions    Diet - low sodium heart healthy    Complete by:  As directed      Increase activity slowly    Complete by:  As directed             Medication List    STOP taking these medications        acamprosate 333 MG tablet  Commonly known as:  CAMPRAL      TAKE these medications        albuterol 108 (90 BASE) MCG/ACT inhaler  Commonly known as:  PROVENTIL HFA;VENTOLIN HFA  Inhale 2 puffs into the lungs every 4 (four) hours as needed for wheezing or shortness of breath.     benazepril 40 MG tablet  Commonly known as:  LOTENSIN  Take 1 tablet (40 mg total) by mouth daily. For high blood pressure control     buPROPion 300 MG 24 hr tablet  Commonly known as:  WELLBUTRIN XL  Take 1 tablet (300 mg total) by mouth daily. For depression     citalopram 20 MG tablet  Commonly known as:  CELEXA  Take 1 tablet (20 mg total) by mouth daily. For depression     clonazePAM 2 MG tablet  Commonly known as:  KLONOPIN  Take 2 mg by mouth 2 (two) times daily.     Fenofibrate 150 MG Caps  Take 1 capsule (150 mg total) by mouth daily. For cholesterol control     furosemide 40 MG tablet  Commonly known as:  LASIX  Take 40 mg by mouth daily.     hydrOXYzine 25 MG capsule  Commonly known as:  VISTARIL  Take 1 capsule (25 mg total) by mouth as needed for anxiety.     loratadine 10 MG tablet  Commonly known as:  CLARITIN  Take 1 tablet (10 mg total) by mouth daily. For allergies     niacin 500 MG CR tablet  Commonly known as:  NIASPAN  Take 500 mg by mouth at bedtime.     omega-3 acid ethyl esters 1 G capsule  Commonly known as:  LOVAZA  Take 1 g by mouth 2 (two) times daily.     simvastatin 40 MG tablet  Commonly known as:  ZOCOR  Take 1 tablet (40 mg total) by mouth daily. For high cholesterol control     traZODone 100 MG tablet  Commonly known as:  DESYREL  Take 1 tablet (100 mg  total) by mouth at bedtime and may repeat dose one time if needed. For sleep            Follow-up Information    Follow up with HEPLER,MARK, PA-C.   Specialty:  Physician Assistant   Contact information:   [address] [place] [address] [phone]       Call Debbora Presto, MD.   Specialty:  Internal Medicine   Why:  As needed   Contact information:   [address] [place] [address] [phone]        The results  of significant diagnostics from this hospitalization (including imaging, microbiology, ancillary and laboratory) are listed below for reference.     Microbiology: No results found for this or any previous visit (from the past 240 hour(s)).   Labs: Basic Metabolic Panel:  Recent Labs Lab 06/29/15 2317 06/30/15 0655  NA 136 140  K 4.2 3.7  CL 104 106  CO2 24 30  GLUCOSE 147* 123*  BUN 17 15  CREATININE 0.91 0.80  CALCIUM 9.4 9.1   Liver Function Tests:  Recent Labs Lab 06/30/15 0655  AST 44*  ALT 34  ALKPHOS 62  BILITOT 0.3  PROT 6.8  ALBUMIN 4.3   CBC:  Recent Labs Lab 06/29/15 2317 06/30/15 0655  WBC 8.2 9.8  NEUTROABS  --  5.3  HGB 15.1 15.1  HCT 45.0 46.2  MCV 94.5 96.0  PLT 182 197    CBG:  Recent Labs Lab 06/29/15 2340  GLUCAP 138*   SIGNED: Time coordinating discharge: 30 minutes  Debbora Presto, MD  [facility] 07/01/2015, 10:42 AM Pager [phone]  If 7PM-7AM, please contact night-coverage [email or web address] Password [email or web address]

## 2015-07-01 NOTE — Progress Notes (Signed)
Clinical Social Work  CSW faxed DC summary to [facility] and left a message with [employer] Dois Davenport). CSW awaiting a return call in order to get patient DC back to facility.  [place], [place] [phone]

## 2015-07-01 NOTE — Progress Notes (Signed)
Clinical Social Work  CSW spoke with Marvin Cooper at [facility] who will accept patient back today. [facility] to get report from RN and will transport patient back to facility.  [place], [place] [phone]

## 2015-08-23 ENCOUNTER — Encounter (HOSPITAL_COMMUNITY): Payer: Self-pay | Admitting: Emergency Medicine

## 2015-08-23 ENCOUNTER — Emergency Department (HOSPITAL_COMMUNITY)
Admission: EM | Admit: 2015-08-23 | Discharge: 2015-08-24 | Discharge status: Against Medical Advice | Payer: BLUE CROSS/BLUE SHIELD | Attending: Emergency Medicine | Admitting: Emergency Medicine

## 2015-08-23 DIAGNOSIS — J45909 Unspecified asthma, uncomplicated: Secondary | ICD-10-CM | POA: Insufficient documentation

## 2015-08-23 DIAGNOSIS — F10129 Alcohol abuse with intoxication, unspecified: Secondary | ICD-10-CM | POA: Diagnosis present

## 2015-08-23 DIAGNOSIS — Z008 Encounter for other general examination: Secondary | ICD-10-CM | POA: Diagnosis not present

## 2015-08-23 DIAGNOSIS — I10 Essential (primary) hypertension: Secondary | ICD-10-CM | POA: Insufficient documentation

## 2015-08-23 DIAGNOSIS — Z720 Tobacco use: Secondary | ICD-10-CM | POA: Insufficient documentation

## 2015-08-23 LAB — CBC
HEMATOCRIT: 43.6 % (ref 39.0–52.0)
HEMOGLOBIN: 14.2 g/dL (ref 13.0–17.0)
MCH: 30.7 pg (ref 26.0–34.0)
MCHC: 32.6 g/dL (ref 30.0–36.0)
MCV: 94.2 fL (ref 78.0–100.0)
PLATELETS: 227 10*3/uL (ref 150–400)
RBC: 4.63 MIL/uL (ref 4.22–5.81)
RDW: 13.7 % (ref 11.5–15.5)
WBC: 10.1 10*3/uL (ref 4.0–10.5)

## 2015-08-23 LAB — COMPREHENSIVE METABOLIC PANEL
ALBUMIN: 3.8 g/dL (ref 3.5–5.0)
ALT: 29 U/L (ref 17–63)
ANION GAP: 11 (ref 5–15)
AST: 33 U/L (ref 15–41)
Alkaline Phosphatase: 58 U/L (ref 38–126)
BUN: <5 mg/dL — ABNORMAL LOW (ref 6–20)
CHLORIDE: 100 mmol/L — AB (ref 101–111)
CO2: 26 mmol/L (ref 22–32)
Calcium: 8.6 mg/dL — ABNORMAL LOW (ref 8.9–10.3)
Creatinine, Ser: 0.81 mg/dL (ref 0.61–1.24)
GFR calc non Af Amer: >60 mL/min (ref >60)
GLUCOSE: 108 mg/dL — AB (ref 65–99)
POTASSIUM: 3.5 mmol/L (ref 3.5–5.1)
SODIUM: 137 mmol/L (ref 135–145)
Total Bilirubin: 0.5 mg/dL (ref 0.3–1.2)
Total Protein: 6.3 g/dL — ABNORMAL LOW (ref 6.5–8.1)

## 2015-08-23 LAB — RAPID URINE DRUG SCREEN, HOSP PERFORMED
AMPHETAMINES: NOT DETECTED
BARBITURATES: NOT DETECTED
BENZODIAZEPINES: POSITIVE — AB
Cocaine: NOT DETECTED
Opiates: NOT DETECTED
TETRAHYDROCANNABINOL: POSITIVE — AB

## 2015-08-23 LAB — ETHANOL: Alcohol, Ethyl (B): 235 mg/dL — ABNORMAL HIGH (ref <5)

## 2015-08-23 NOTE — ED Notes (Signed)
Called to go back to room with no answer.  

## 2015-08-23 NOTE — ED Notes (Signed)
Pt c/o alcohol intoxication and requesting detox. Pt had a bottle of vodka this morning. Pt reports that his wife kicked him out and that the sherrif came to the house.

## 2015-08-23 NOTE — ED Notes (Signed)
Called to triage-- no response

## 2015-08-23 NOTE — ED Notes (Signed)
Called multiple times with no answer.

## 2015-10-18 ENCOUNTER — Emergency Department (HOSPITAL_BASED_OUTPATIENT_CLINIC_OR_DEPARTMENT_OTHER)
Admission: EM | Admit: 2015-10-18 | Discharge: 2015-10-18 | Disposition: A | Discharge status: Home / Self Care | Payer: BLUE CROSS/BLUE SHIELD | Attending: Emergency Medicine | Admitting: Emergency Medicine

## 2015-10-18 ENCOUNTER — Encounter (HOSPITAL_BASED_OUTPATIENT_CLINIC_OR_DEPARTMENT_OTHER): Payer: Self-pay | Admitting: Emergency Medicine

## 2015-10-18 DIAGNOSIS — I10 Essential (primary) hypertension: Secondary | ICD-10-CM | POA: Diagnosis not present

## 2015-10-18 DIAGNOSIS — S01112A Laceration without foreign body of left eyelid and periocular area, initial encounter: Secondary | ICD-10-CM | POA: Insufficient documentation

## 2015-10-18 DIAGNOSIS — Y998 Other external cause status: Secondary | ICD-10-CM | POA: Insufficient documentation

## 2015-10-18 DIAGNOSIS — J45909 Unspecified asthma, uncomplicated: Secondary | ICD-10-CM | POA: Insufficient documentation

## 2015-10-18 DIAGNOSIS — Y9289 Other specified places as the place of occurrence of the external cause: Secondary | ICD-10-CM | POA: Diagnosis not present

## 2015-10-18 DIAGNOSIS — S0181XA Laceration without foreign body of other part of head, initial encounter: Secondary | ICD-10-CM

## 2015-10-18 DIAGNOSIS — W228XXA Striking against or struck by other objects, initial encounter: Secondary | ICD-10-CM | POA: Diagnosis not present

## 2015-10-18 DIAGNOSIS — Y9389 Activity, other specified: Secondary | ICD-10-CM | POA: Diagnosis not present

## 2015-10-18 DIAGNOSIS — Z79899 Other long term (current) drug therapy: Secondary | ICD-10-CM | POA: Insufficient documentation

## 2015-10-18 DIAGNOSIS — F17210 Nicotine dependence, cigarettes, uncomplicated: Secondary | ICD-10-CM | POA: Diagnosis not present

## 2015-10-18 DIAGNOSIS — E7800 Pure hypercholesterolemia, unspecified: Secondary | ICD-10-CM | POA: Diagnosis not present

## 2015-10-18 MED ORDER — LIDOCAINE-EPINEPHRINE (PF) 2 %-1:200000 IJ SOLN
20.0000 mL | Freq: Once | INTRAMUSCULAR | Status: AC
  Administered 2015-10-18: 20 mL via INTRADERMAL
  Filled 2015-10-18: qty 20

## 2015-10-18 NOTE — Discharge Instructions (Signed)
Facial Laceration ° A facial laceration is a cut on the face. These injuries can be painful and cause bleeding. Lacerations usually heal quickly, but they need special care to reduce scarring. °DIAGNOSIS  °Your health care provider will take a medical history, ask for details about how the injury occurred, and examine the wound to determine how deep the cut is. °TREATMENT  °Some facial lacerations may not require closure. Others may not be able to be closed because of an increased risk of infection. The risk of infection and the chance for successful closure will depend on various factors, including the amount of time since the injury occurred. °The wound may be cleaned to help prevent infection. If closure is appropriate, pain medicines may be given if needed. Your health care provider will use stitches (sutures), wound glue (adhesive), or skin adhesive strips to repair the laceration. These tools bring the skin edges together to allow for faster healing and a better cosmetic outcome. If needed, you may also be given a tetanus shot. °HOME CARE INSTRUCTIONS °· Only take over-the-counter or prescription medicines as directed by your health care provider. °· Follow your health care provider's instructions for wound care. These instructions will vary depending on the technique used for closing the wound. °For Sutures: °· Keep the wound clean and dry.   °· If you were given a bandage (dressing), you should change it at least once a day. Also change the dressing if it becomes wet or dirty, or as directed by your health care provider.   °· Wash the wound with soap and water 2 times a day. Rinse the wound off with water to remove all soap. Pat the wound dry with a clean towel.   °· After cleaning, apply a thin layer of the antibiotic ointment recommended by your health care provider. This will help prevent infection and keep the dressing from sticking.   °· You may shower as usual after the first 24 hours. Do not soak the  wound in water until the sutures are removed.   °· Get your sutures removed as directed by your health care provider. With facial lacerations, sutures should usually be taken out after 4-5 days to avoid stitch marks.   °· Wait a few days after your sutures are removed before applying any makeup. °For Skin Adhesive Strips: °· Keep the wound clean and dry.   °· Do not get the skin adhesive strips wet. You may bathe carefully, using caution to keep the wound dry.   °· If the wound gets wet, pat it dry with a clean towel.   °· Skin adhesive strips will fall off on their own. You may trim the strips as the wound heals. Do not remove skin adhesive strips that are still stuck to the wound. They will fall off in time.   °For Wound Adhesive: °· You may briefly wet your wound in the shower or bath. Do not soak or scrub the wound. Do not swim. Avoid periods of heavy sweating until the skin adhesive has fallen off on its own. After showering or bathing, gently pat the wound dry with a clean towel.   °· Do not apply liquid medicine, cream medicine, ointment medicine, or makeup to your wound while the skin adhesive is in place. This may loosen the film before your wound is healed.   °· If a dressing is placed over the wound, be careful not to apply tape directly over the skin adhesive. This may cause the adhesive to be pulled off before the wound is healed.   °· Avoid   prolonged exposure to sunlight or tanning lamps while the skin adhesive is in place. °· The skin adhesive will usually remain in place for 5-10 days, then naturally fall off the skin. Do not pick at the adhesive film.   °After Healing: °Once the wound has healed, cover the wound with sunscreen during the day for 1 full year. This can help minimize scarring. Exposure to ultraviolet light in the first year will darken the scar. It can take 1-2 years for the scar to lose its redness and to heal completely.  °SEEK MEDICAL CARE IF: °· You have a fever. °SEEK IMMEDIATE  MEDICAL CARE IF: °· You have redness, pain, or swelling around the wound.   °· You see a yellowish-white fluid (pus) coming from the wound.   °  °This information is not intended to replace advice given to you by your health care provider. Make sure you discuss any questions you have with your health care provider. °  °Document Released: 12/08/2004 Document Revised: 11/21/2014 Document Reviewed: 06/13/2013 °Elsevier Interactive Patient Education ©2016 [employer] ° °

## 2015-10-18 NOTE — ED Provider Notes (Signed)
CSN: [id number]     Arrival date & time 10/18/15  1931 History   First MD Initiated Contact with Patient 10/18/15 2001     Chief Complaint  Patient presents with  . Facial Laceration     (Consider location/radiation/quality/duration/timing/severity/associated sxs/prior Treatment) HPI Comments: Facial laceration that occurred after impacting with a swinging door at home. No LOC. Laceration is over the left eyebrow. He denies neck pain, visual change, other injury.  The history is provided by the patient. No language interpreter was used.    Past Medical History  Diagnosis Date  . Hypertension   . Hypercholesteremia   . Alcohol abuse   . Asthma    Past Surgical History  Procedure Laterality Date  . Knee surgery    . Leg surgery Left Age 44    ORIF lower leg  . Adenoidectomy  Age 44  . Shoulder arthroscopy     Family History  Problem Relation Age of Onset  . Alcohol abuse Brother   . Alcohol abuse Sister   . Alcohol abuse Maternal Grandfather    Social History  Substance Use Topics  . Smoking status: Current Every Day Smoker -- 0.25 packs/day for 20 years    Types: Cigarettes  . Smokeless tobacco: Never Used  . Alcohol Use: 4.8 oz/week    8 Cans of beer per week    Review of Systems  Constitutional: Negative for fever.  Eyes: Negative for pain and visual disturbance.  Gastrointestinal: Negative for nausea.  Musculoskeletal: Negative for neck pain.  Skin: Positive for wound.  Neurological: Negative for headaches.      Allergies  Review of patient's allergies indicates no known allergies.  Home Medications   Prior to Admission medications   Medication Sig Start Date End Date Taking? Authorizing Provider  niacin (NIASPAN) 500 MG CR tablet Take 500 mg by mouth at bedtime.  10/21/14  Yes Historical Provider, MD  omega-3 acid ethyl esters (LOVAZA) 1 G capsule Take 1 g by mouth 2 (two) times daily.  10/21/14  Yes Historical Provider, MD  albuterol (PROVENTIL  HFA;VENTOLIN HFA) 108 (90 BASE) MCG/ACT inhaler Inhale 2 puffs into the lungs every 4 (four) hours as needed for wheezing or shortness of breath. 10/18/14   Sanjuana KavaAgnes I Nwoko, NP  benazepril (LOTENSIN) 40 MG tablet Take 1 tablet (40 mg total) by mouth daily. For high blood pressure control 10/18/14   Sanjuana KavaAgnes I Nwoko, NP  buPROPion (WELLBUTRIN XL) 300 MG 24 hr tablet Take 1 tablet (300 mg total) by mouth daily. For depression 11/28/14   Cleotis NipperSyed T Arfeen, MD  citalopram (CELEXA) 20 MG tablet Take 1 tablet (20 mg total) by mouth daily. For depression 11/28/14   Cleotis NipperSyed T Arfeen, MD  Fenofibrate 150 MG CAPS Take 1 capsule (150 mg total) by mouth daily. For cholesterol control 10/18/14   Sanjuana KavaAgnes I Nwoko, NP  hydrOXYzine (VISTARIL) 25 MG capsule Take 1 capsule (25 mg total) by mouth as needed for anxiety. Patient not taking: Reported on 06/30/2015 11/28/14   Cleotis NipperSyed T Arfeen, MD  loratadine (CLARITIN) 10 MG tablet Take 1 tablet (10 mg total) by mouth daily. For allergies 10/18/14   Sanjuana KavaAgnes I Nwoko, NP  simvastatin (ZOCOR) 40 MG tablet Take 1 tablet (40 mg total) by mouth daily. For high cholesterol control 10/18/14   Sanjuana KavaAgnes I Nwoko, NP  traZODone (DESYREL) 100 MG tablet Take 1 tablet (100 mg total) by mouth at bedtime and may repeat dose one time if needed. For sleep Patient taking  differently: Take 100 mg by mouth at bedtime as needed. For sleep 11/28/14   Cleotis Nipper, MD   BP 147/88 mmHg  Pulse 102  Temp(Src) 98 F (36.7 C) (Oral)  Resp 20  Ht  (1.702 m)  Wt 90.719 kg  BMI 31.32 kg/m2  SpO2 100% Physical Exam  Constitutional: He is oriented to person, place, and time. He appears well-developed and well-nourished.  Eyes: Conjunctivae are normal. Pupils are equal, round, and reactive to light.  No lid swelling or redness. No hyphema or corneal cloudiness.  Neck: Normal range of motion.  Pulmonary/Chest: Effort normal.  Musculoskeletal: Normal range of motion.  No midline cervical tenderness.   Neurological: He is  alert and oriented to person, place, and time.  Skin: Skin is warm and dry.  2 cm laceration across mid portion of left eye brow. No active bleeding. No significant swelling.   Psychiatric: He has a normal mood and affect.    ED Course  Procedures (including critical care time) Labs Review Labs Reviewed - No data to display  Imaging Review No results found. I have personally reviewed and evaluated these images and lab results as part of my medical decision-making.   EKG Interpretation None     LACERATION REPAIR Performed by: Elpidio Anis A Authorized by: Elpidio Anis A Consent: Verbal consent obtained. Risks and benefits: risks, benefits and alternatives were discussed Consent given by: patient Patient identity confirmed: provided demographic data Prepped and Draped in normal sterile fashion Wound explored  Laceration Location: left eye brow  Laceration Length: 2cm  No Foreign Bodies seen or palpated  Anesthesia: local infiltration  Local anesthetic: lidocaine 1% w/epinephrine  Anesthetic total: 2 ml  Irrigation method: syringe Amount of cleaning: standard  Skin closure: 6-0 Vicryl, 6-0 Prolene  Number of sutures: 2 SQ, 6 dermal  Technique: simple interrupted  Patient tolerance: Patient tolerated the procedure well with no immediate complications.  MDM   Final diagnoses:  None    1. Facial laceration  Uncomplicated facial laceration requiring repair as above. No evidence intracranial head injury.    Elpidio Anis, PA-C 10/18/15 2057  Loren Racer, MD 10/19/15 2242

## 2015-10-18 NOTE — ED Notes (Signed)
Pt reports he was working on door and it swung back and hit him in face, lacerating left eyebrow, pt admits to drinking about 6 beers

## 2016-07-09 ENCOUNTER — Emergency Department (HOSPITAL_COMMUNITY)
Admission: EM | Admit: 2016-07-09 | Discharge: 2016-07-09 | Disposition: A | Discharge status: Home / Self Care | Payer: BLUE CROSS/BLUE SHIELD | Source: Home / Self Care | Attending: Emergency Medicine | Admitting: Emergency Medicine

## 2016-07-09 ENCOUNTER — Emergency Department (HOSPITAL_COMMUNITY)
Admission: EM | Admit: 2016-07-09 | Discharge: 2016-07-10 | Disposition: A | Discharge status: Home / Self Care | Payer: BLUE CROSS/BLUE SHIELD | Attending: Emergency Medicine | Admitting: Emergency Medicine

## 2016-07-09 ENCOUNTER — Encounter (HOSPITAL_COMMUNITY): Payer: Self-pay | Admitting: Emergency Medicine

## 2016-07-09 DIAGNOSIS — F17210 Nicotine dependence, cigarettes, uncomplicated: Secondary | ICD-10-CM | POA: Insufficient documentation

## 2016-07-09 DIAGNOSIS — I10 Essential (primary) hypertension: Secondary | ICD-10-CM

## 2016-07-09 DIAGNOSIS — F1010 Alcohol abuse, uncomplicated: Secondary | ICD-10-CM

## 2016-07-09 DIAGNOSIS — Z79899 Other long term (current) drug therapy: Secondary | ICD-10-CM | POA: Insufficient documentation

## 2016-07-09 DIAGNOSIS — F1020 Alcohol dependence, uncomplicated: Secondary | ICD-10-CM | POA: Diagnosis present

## 2016-07-09 DIAGNOSIS — J45909 Unspecified asthma, uncomplicated: Secondary | ICD-10-CM | POA: Insufficient documentation

## 2016-07-09 DIAGNOSIS — Z7951 Long term (current) use of inhaled steroids: Secondary | ICD-10-CM | POA: Insufficient documentation

## 2016-07-09 DIAGNOSIS — F329 Major depressive disorder, single episode, unspecified: Secondary | ICD-10-CM | POA: Insufficient documentation

## 2016-07-09 DIAGNOSIS — F1014 Alcohol abuse with alcohol-induced mood disorder: Secondary | ICD-10-CM | POA: Diagnosis not present

## 2016-07-09 DIAGNOSIS — F10129 Alcohol abuse with intoxication, unspecified: Secondary | ICD-10-CM | POA: Diagnosis present

## 2016-07-09 DIAGNOSIS — F10920 Alcohol use, unspecified with intoxication, uncomplicated: Secondary | ICD-10-CM

## 2016-07-09 DIAGNOSIS — F10120 Alcohol abuse with intoxication, uncomplicated: Secondary | ICD-10-CM

## 2016-07-09 DIAGNOSIS — F32A Depression, unspecified: Secondary | ICD-10-CM

## 2016-07-09 LAB — COMPREHENSIVE METABOLIC PANEL
ALK PHOS: 79 U/L (ref 38–126)
ALT: 35 U/L (ref 17–63)
ANION GAP: 9 (ref 5–15)
AST: 30 U/L (ref 15–41)
Albumin: 4.3 g/dL (ref 3.5–5.0)
BILIRUBIN TOTAL: 0.4 mg/dL (ref 0.3–1.2)
BUN: 9 mg/dL (ref 6–20)
CALCIUM: 8.9 mg/dL (ref 8.9–10.3)
CO2: 30 mmol/L (ref 22–32)
CREATININE: 0.84 mg/dL (ref 0.61–1.24)
Chloride: 103 mmol/L (ref 101–111)
Glucose, Bld: 117 mg/dL — ABNORMAL HIGH (ref 65–99)
Potassium: 3.6 mmol/L (ref 3.5–5.1)
SODIUM: 142 mmol/L (ref 135–145)
Total Protein: 7.1 g/dL (ref 6.5–8.1)

## 2016-07-09 LAB — CBG MONITORING, ED: Glucose-Capillary: 112 mg/dL — ABNORMAL HIGH (ref 65–99)

## 2016-07-09 LAB — ETHANOL: ALCOHOL ETHYL (B): 217 mg/dL — AB (ref <5)

## 2016-07-09 LAB — RAPID URINE DRUG SCREEN, HOSP PERFORMED
Amphetamines: NOT DETECTED
Barbiturates: NOT DETECTED
Benzodiazepines: NOT DETECTED
Cocaine: NOT DETECTED
OPIATES: NOT DETECTED
TETRAHYDROCANNABINOL: NOT DETECTED

## 2016-07-09 LAB — ACETAMINOPHEN LEVEL: Acetaminophen (Tylenol), Serum: <10 ug/mL — ABNORMAL LOW (ref 10–30)

## 2016-07-09 LAB — CBC
HCT: 47.2 % (ref 39.0–52.0)
Hemoglobin: 16.2 g/dL (ref 13.0–17.0)
MCH: 31 pg (ref 26.0–34.0)
MCHC: 34.3 g/dL (ref 30.0–36.0)
MCV: 90.2 fL (ref 78.0–100.0)
PLATELETS: 219 10*3/uL (ref 150–400)
RBC: 5.23 MIL/uL (ref 4.22–5.81)
RDW: 15 % (ref 11.5–15.5)
WBC: 12.8 10*3/uL — ABNORMAL HIGH (ref 4.0–10.5)

## 2016-07-09 LAB — SALICYLATE LEVEL

## 2016-07-09 MED ORDER — SIMVASTATIN 40 MG PO TABS
40.0000 mg | ORAL_TABLET | Freq: Every day | ORAL | Status: DC
  Administered 2016-07-09: 40 mg via ORAL
  Filled 2016-07-09: qty 1

## 2016-07-09 MED ORDER — OMEGA-3-ACID ETHYL ESTERS 1 G PO CAPS
1.0000 g | ORAL_CAPSULE | Freq: Two times a day (BID) | ORAL | Status: DC
  Administered 2016-07-09 – 2016-07-10 (×2): 1 g via ORAL
  Filled 2016-07-09 (×2): qty 1

## 2016-07-09 MED ORDER — NICOTINE 21 MG/24HR TD PT24
21.0000 mg | MEDICATED_PATCH | Freq: Every day | TRANSDERMAL | Status: DC
  Administered 2016-07-09 – 2016-07-10 (×2): 21 mg via TRANSDERMAL
  Filled 2016-07-09 (×2): qty 1

## 2016-07-09 MED ORDER — LORATADINE 10 MG PO TABS
10.0000 mg | ORAL_TABLET | Freq: Every day | ORAL | Status: DC
  Administered 2016-07-09 – 2016-07-10 (×2): 10 mg via ORAL
  Filled 2016-07-09 (×2): qty 1

## 2016-07-09 MED ORDER — ALUM & MAG HYDROXIDE-SIMETH 200-200-20 MG/5ML PO SUSP: 30.0000 mL | ORAL | Status: DC | PRN

## 2016-07-09 MED ORDER — BENAZEPRIL HCL 40 MG PO TABS
40.0000 mg | ORAL_TABLET | Freq: Every day | ORAL | Status: DC
  Administered 2016-07-09 – 2016-07-10 (×2): 40 mg via ORAL
  Filled 2016-07-09 (×2): qty 1

## 2016-07-09 MED ORDER — BUPROPION HCL ER (XL) 150 MG PO TB24
300.0000 mg | ORAL_TABLET | Freq: Every day | ORAL | Status: DC
  Administered 2016-07-09 – 2016-07-10 (×2): 300 mg via ORAL
  Filled 2016-07-09 (×2): qty 2

## 2016-07-09 MED ORDER — CITALOPRAM HYDROBROMIDE 10 MG PO TABS
20.0000 mg | ORAL_TABLET | Freq: Every day | ORAL | Status: DC
  Administered 2016-07-09 – 2016-07-10 (×2): 20 mg via ORAL
  Filled 2016-07-09 (×2): qty 2

## 2016-07-09 MED ORDER — ONDANSETRON HCL 4 MG PO TABS: 4.0000 mg | ORAL_TABLET | Freq: Three times a day (TID) | ORAL | Status: DC | PRN

## 2016-07-09 MED ORDER — LORAZEPAM 1 MG PO TABS: 1.0000 mg | ORAL_TABLET | Freq: Four times a day (QID) | ORAL | Status: DC | PRN

## 2016-07-09 MED ORDER — HYDROXYZINE PAMOATE 25 MG PO CAPS: 25.0000 mg | ORAL_CAPSULE | ORAL | Status: DC | PRN

## 2016-07-09 MED ORDER — ACETAMINOPHEN 325 MG PO TABS
650.0000 mg | ORAL_TABLET | ORAL | Status: DC | PRN
  Administered 2016-07-09: 650 mg via ORAL
  Filled 2016-07-09: qty 2

## 2016-07-09 MED ORDER — HYDROXYZINE HCL 25 MG PO TABS: 25.0000 mg | ORAL_TABLET | ORAL | Status: DC | PRN

## 2016-07-09 MED ORDER — FENOFIBRATE 54 MG PO TABS: 54.0000 mg | ORAL_TABLET | Freq: Every day | ORAL | Status: DC

## 2016-07-09 MED ORDER — FENOFIBRATE 160 MG PO TABS
160.0000 mg | ORAL_TABLET | Freq: Every day | ORAL | Status: DC
  Administered 2016-07-09 – 2016-07-10 (×2): 160 mg via ORAL
  Filled 2016-07-09 (×2): qty 1

## 2016-07-09 MED ORDER — ALBUTEROL SULFATE HFA 108 (90 BASE) MCG/ACT IN AERS: 2.0000 | INHALATION_SPRAY | RESPIRATORY_TRACT | Status: DC | PRN

## 2016-07-09 MED ORDER — TRAZODONE HCL 50 MG PO TABS: 50.0000 mg | ORAL_TABLET | Freq: Every evening | ORAL | Status: DC | PRN

## 2016-07-09 MED ORDER — NIACIN ER (ANTIHYPERLIPIDEMIC) 500 MG PO TBCR
500.0000 mg | EXTENDED_RELEASE_TABLET | Freq: Every day | ORAL | Status: DC
  Administered 2016-07-09: 500 mg via ORAL
  Filled 2016-07-09: qty 1

## 2016-07-09 NOTE — ED Notes (Signed)
Pharm tech into see 

## 2016-07-09 NOTE — ED Notes (Signed)
Patient noted sleeping in room. No complaints, stable, in no acute distress. Q15 minute rounds and monitoring via Security Cameras to continue.  

## 2016-07-09 NOTE — ED Notes (Signed)
Fayrene FearingJames MD aware of oxygen intervention. See trending VS.

## 2016-07-09 NOTE — ED Notes (Signed)
Report to include situation, background, assessment and recommendations from Janie Rambo RN. Patient sleeping, respirations regular and unlabored. Q15 minute rounds and security camera observation to continue.   

## 2016-07-09 NOTE — ED Notes (Addendum)
Pt reports a pint of vodka and (3) 1 mg Klonopin at 1200 today. Spoke with Thyra [facility]Jeana from [facility] recommends: ethanol level STAT, salicylate level STAT, and acetaminophen level at 1600, hydration, and 6 hour observation OR UNTIL BASELINE. Fayrene FearingJames MD aware.

## 2016-07-09 NOTE — ED Notes (Signed)
[facility].

## 2016-07-09 NOTE — ED Notes (Signed)
Pt removed IV. Dressing applied.

## 2016-07-09 NOTE — ED Notes (Signed)
Up to the bathroom, ambulatory w/o difficulty 

## 2016-07-09 NOTE — BH Assessment (Signed)
Assessment Note  Marvin Cooper is an 45 y.o. male that presents this date to [facility] impaired stating he has been maintaining his sobriety for the last ten months but relapsed last night 07/08/16 due to hearing of a friend's suicide. Patient stated he has a prior history of ETOH use reporting one prior inpatient admission in 2016 at [facility] for 28 days and has been maintaining his sobriety until last night. Patient reports attending daily AA meetings and has a sponsor. Patient denies any S/I or thoughts of self harm. Patient contacted [facility] department after taking 3 1 mg Klonopin and 1 pint of liquor last night after hearing about his friend's death. Patient stated he was "very overwhelmed" but disappointed in himself for relapsing. Patient stated he felt he needed to come to the hospital to "chill out" and be monitored to "get the alcohol out of his system." Patient denies any other illicit SA use. Patient denies any H/I or AVH. Patient states he receives medication from [facility] MD where he has been diagnosed with an anxiety D/O and has been prescribed Klonopin and Celexa. Patient states he takes his medications as indicated "two 1 mg Klonopin a day" and what he thinks is Celexa once daily. Patient is oriented to time/place but is slow to interact with this writer due to patient being drowsy. Patient reports being married Marvin Cooper [phone]) and requests this writer to contact his wife to inform of his current status. Admission note states: "Patient presents to the hospital after drinking heavily today. He has a history of alcohol abuse his been sober for the past 9 months. He discovered yesterday that his coworker committed suicide and he states he didn't know how to handle this news. He reports he drove home from work and by alcohol liquor and began drinking heavily. He has no suicidal or homicidal thoughts. He is upset at himself for drinking heavily the day after 9 months of sobriety. He reports that  his AA sponsor called him several times but he did not answer the phone. He has no other complaints at this time". Patient per Shaune Pollack DNP will be monitored and re-evaluated in the a.m   Diagnosis: GAD, Alcohol use severe    Past Medical History:  Past Medical History:  Diagnosis Date  . Alcohol abuse   . Asthma   . Hypercholesteremia   . Hypertension     Past Surgical History:  Procedure Laterality Date  . ADENOIDECTOMY  Age 71  . KNEE SURGERY    . LEG SURGERY Left Age 54   ORIF lower leg  . SHOULDER ARTHROSCOPY      Family History:  Family History  Problem Relation Age of Onset  . Alcohol abuse Brother   . Alcohol abuse Sister   . Alcohol abuse Maternal Grandfather     Social History:  reports that he has been smoking Cigarettes.  He has a 5.00 pack-year smoking history. He has never used smokeless tobacco. He reports that he drinks about 4.8 oz of alcohol per week . He reports that he uses drugs, including Marijuana.  Additional Social History:  Alcohol / Drug Use Pain Medications: See home med list Prescriptions: See home med list Over the Counter: See home med list History of alcohol / drug use?: Yes Longest period of sobriety (when/how long): 90 days last year Negative Consequences of Use: Personal relationships, Work / School Substance #1 Name of Substance 1: Alcohol 1 - Age of First Use: 21 1 - Amount (  size/oz): 4 or 5 12 oz beers 1 pint of liquor 1 - Frequency: just relasped on 8/25 1 - Duration: pt just relapsed on 8/25 1 - Last Use / Amount: 07/08/16 pt reported using 1 pint of liquor  CIWA: CIWA-Ar BP: 119/83 Pulse Rate: 100 Nausea and Vomiting: no nausea and no vomiting Tactile Disturbances: none Tremor: no tremor Auditory Disturbances: not present Paroxysmal Sweats: two Visual Disturbances: not present Anxiety: no anxiety, at ease Headache, Fullness in Head: none present Agitation: normal activity Orientation and Clouding of Sensorium: disoriented  for data by no more than 2 calendar days CIWA-Ar Total: 4 COWS:    Allergies: No Known Allergies  Home Medications:  (Not in a hospital admission)  OB/GYN Status:  No LMP for male patient.  General Assessment Data Location of Assessment: [facility] TTS Assessment: In system Is this a Tele or Face-to-Face Assessment?: Face-to-Face Is this an Initial Assessment or a Re-assessment for this encounter?: Initial Assessment Marital status: Married [place] name: na Is patient pregnant?: No Pregnancy Status: No Living Arrangements: Spouse/significant other Can pt return to current living arrangement?: Yes Admission Status: Voluntary Is patient capable of signing voluntary admission?: Yes Referral Source: Self/Family/Friend Insurance type: Tax adviser Exam [facility] Walk-in ONLY) Medical Exam completed: Yes  Crisis Care Plan Living Arrangements: Spouse/significant other Legal Guardian:  (na) Name of Psychiatrist: [facility] MD Name of Therapist: None  Education Status Is patient currently in school?: No Current Grade: na Highest grade of school patient has completed: 12 Name of school: na Contact person: na  Risk to self with the past 6 months Suicidal Ideation: No Has patient been a risk to self within the past 6 months prior to admission? : No Suicidal Intent: No Has patient had any suicidal intent within the past 6 months prior to admission? : No Is patient at risk for suicide?: No Suicidal Plan?: No Has patient had any suicidal plan within the past 6 months prior to admission? : No Access to Means: No What has been your use of drugs/alcohol within the last 12 months?: Current use Previous Attempts/Gestures: No How many times?: 0 Other Self Harm Risks: none Triggers for Past Attempts: Unknown Intentional Self Injurious Behavior: None Family Suicide History: No Recent stressful life event(s): Other (Comment) (friend's death) Persecutory voices/beliefs?: No Depression:  Yes Depression Symptoms: Loss of interest in usual pleasures, Feeling worthless/self pity Substance abuse history and/or treatment for substance abuse?: Yes Suicide prevention information given to non-admitted patients: Not applicable  Risk to Others within the past 6 months Homicidal Ideation: No Does patient have any lifetime risk of violence toward others beyond the six months prior to admission? : No Thoughts of Harm to Others: No Current Homicidal Intent: No Current Homicidal Plan: No Access to Homicidal Means: No Identified Victim: na History of harm to others?: No Assessment of Violence: None Noted Violent Behavior Description: na Does patient have access to weapons?: No Criminal Charges Pending?: No Does patient have a court date: No Is patient on probation?: No  Psychosis Hallucinations: None noted Delusions: None noted  Mental Status Report Appearance/Hygiene: Unremarkable Eye Contact: Poor Motor Activity: Freedom of movement Speech: Soft, Slow Level of Consciousness: Drowsy Mood: Depressed Affect: Depressed Anxiety Level: Moderate Thought Processes: Coherent, Relevant Judgement: Partial Orientation: Person, Place, Time Obsessive Compulsive Thoughts/Behaviors: None  Cognitive Functioning Concentration: Decreased Memory: Recent Intact, Remote Intact IQ: Average Insight: Fair Impulse Control: Poor Appetite: Fair Weight Loss: 0 Weight Gain: 0 Sleep: No Change Total Hours of  Sleep: 7 Vegetative Symptoms: None  ADLScreening [facility] Assessment Services) Patient's cognitive ability adequate to safely complete daily activities?: Yes Patient able to express need for assistance with ADLs?: Yes Independently performs ADLs?: Yes (appropriate for developmental age)  Prior Inpatient Therapy Prior Inpatient Therapy: Yes Prior Therapy Dates: 2016 [name] Reason for Treatment: SA issues  Prior Outpatient Therapy Prior  Outpatient Therapy: Yes Prior Therapy Dates: 2017 Prior Therapy Facilty/Provider(s): [facility] Reason for Treatment: SA issues Does patient have an ACCT team?: No Does patient have Intensive In-House Services?  : No Does patient have [facility] services? : No Does patient have P4CC services?: No  ADL Screening (condition at time of admission) Patient's cognitive ability adequate to safely complete daily activities?: Yes Is the patient deaf or have difficulty hearing?: No Does the patient have difficulty seeing, even when wearing glasses/contacts?: No Does the patient have difficulty concentrating, remembering, or making decisions?: No Patient able to express need for assistance with ADLs?: Yes Does the patient have difficulty dressing or bathing?: No Independently performs ADLs?: Yes (appropriate for developmental age) Does the patient have difficulty walking or climbing stairs?: No Weakness of Legs: None Weakness of Arms/Hands: None  Home Assistive Devices/Equipment Home Assistive Devices/Equipment: None  Therapy Consults (therapy consults require a physician order) PT Evaluation Needed: No OT Evalulation Needed: No SLP Evaluation Needed: No Abuse/Neglect Assessment (Assessment to be complete while patient is alone) Physical Abuse: Denies Verbal Abuse: Denies Sexual Abuse: Denies Exploitation of patient/patient's resources: Denies Self-Neglect: Denies Values / Beliefs Cultural Requests During Hospitalization: None Spiritual Requests During Hospitalization: None Consults Spiritual Care Consult Needed: No Social Work Consult Needed: No Merchant navy officerAdvance Directives (For Healthcare) Does patient have an advance directive?: No Would patient like information on creating an advanced directive?: No - patient declined information    Additional Information 1:1 In Past 12 Months?: No CIRT Risk: No Elopement Risk: No Does patient have medical clearance?: Yes     Disposition: Patient per  Shaune PollackLord DNP will be monitored and re-evaluated in the a.m  Disposition Initial Assessment Completed for this Encounter: Yes Disposition of Patient: Other dispositions Other disposition(s):  (pt will be monitored and re-evaluated in the a.m.)  On Site Evaluation by:   Reviewed with Physician:    Alfredia Fergusonavid L Derius Ghosh 07/09/2016 2:32 PM

## 2016-07-09 NOTE — ED Triage Notes (Signed)
Pt was d/c from here earlier today after being brought in last night for alcohol detox. Pt sts he was discharged and went home and drank "a lot" and swallowed "a lot" of klonopin. Pt denies SI. sts "I just wanted to be numb to the world." Pt's friend shot himself two days ago and pt is distraught over it. Pt is very unsteady on his feet, speech slurred, pt belching in triage.

## 2016-07-09 NOTE — ED Triage Notes (Signed)
Pt transported from home by [facility] by his request to be brought to the ED for detox. Uncoordinated gait, hx of seizures with detox.

## 2016-07-09 NOTE — ED Notes (Signed)
RN will draw blood work 

## 2016-07-09 NOTE — ED Notes (Signed)
Pt request to void. Pt ambulatory without assist to nearby restroom. Gait steady.

## 2016-07-09 NOTE — ED Provider Notes (Signed)
[facility]-EMERGENCY DEPT Provider Note   CSN: [id number] Arrival date & time: 07/09/16  0422     History   Chief Complaint Chief Complaint  Patient presents with  . Other    req detox    HPI Marvin Cooper is a 45 y.o. male.  HPI  Patient presents to the hospital after drinking heavily today.  He has a history of alcohol abuse his been sober for the past 9 months.  He discovered yesterday that his coworker committed suicide and he states he didn't know how to handle this news.  He reports he drove home from work and by alcohol liquor and began drinking heavily.  He has no suicidal or homicidal thoughts.  He is upset at himself for drinking heavily the day after 9 months of sobriety.  He reports that his AA sponsor called him several times but he did not answer the phone.  He has no other complaints at this time.   Past Medical History:  Diagnosis Date  . Alcohol abuse   . Asthma   . Hypercholesteremia   . Hypertension     Patient Active Problem List   Diagnosis Date Noted  . Seizure (HCC) 06/30/2015  . Seizure-like activity (HCC) 06/30/2015  . Scalp laceration 06/30/2015  . Alcohol dependence (HCC) 10/16/2014  . Major depression, recurrent (HCC) 10/16/2014    Past Surgical History:  Procedure Laterality Date  . ADENOIDECTOMY  Age 65  . KNEE SURGERY    . LEG SURGERY Left Age 45   ORIF lower leg  . SHOULDER ARTHROSCOPY         Home Medications    Prior to Admission medications   Medication Sig Start Date End Date Taking? Authorizing Provider  albuterol (PROVENTIL HFA;VENTOLIN HFA) 108 (90 BASE) MCG/ACT inhaler Inhale 2 puffs into the lungs every 4 (four) hours as needed for wheezing or shortness of breath. 10/18/14   Sanjuana KavaAgnes I Nwoko, NP  benazepril (LOTENSIN) 40 MG tablet Take 1 tablet (40 mg total) by mouth daily. For high blood pressure control 10/18/14   Sanjuana KavaAgnes I Nwoko, NP  buPROPion (WELLBUTRIN XL) 300 MG 24 hr tablet Take 1 tablet (300 mg total) by mouth daily. For  depression 11/28/14   Cleotis NipperSyed T Arfeen, MD  citalopram (CELEXA) 20 MG tablet Take 1 tablet (20 mg total) by mouth daily. For depression 11/28/14   Cleotis NipperSyed T Arfeen, MD  Fenofibrate 150 MG CAPS Take 1 capsule (150 mg total) by mouth daily. For cholesterol control 10/18/14   Sanjuana KavaAgnes I Nwoko, NP  hydrOXYzine (VISTARIL) 25 MG capsule Take 1 capsule (25 mg total) by mouth as needed for anxiety. Patient not taking: Reported on 06/30/2015 11/28/14   Cleotis NipperSyed T Arfeen, MD  loratadine (CLARITIN) 10 MG tablet Take 1 tablet (10 mg total) by mouth daily. For allergies 10/18/14   Sanjuana KavaAgnes I Nwoko, NP  niacin (NIASPAN) 500 MG CR tablet Take 500 mg by mouth at bedtime.  10/21/14   Historical Provider, MD  omega-3 acid ethyl esters (LOVAZA) 1 G capsule Take 1 g by mouth 2 (two) times daily.  10/21/14   Historical Provider, MD  simvastatin (ZOCOR) 40 MG tablet Take 1 tablet (40 mg total) by mouth daily. For high cholesterol control 10/18/14   Sanjuana KavaAgnes I Nwoko, NP  traZODone (DESYREL) 100 MG tablet Take 1 tablet (100 mg total) by mouth at bedtime and may repeat dose one time if needed. For sleep Patient taking differently: Take 100 mg by mouth at bedtime as needed.  For sleep 11/28/14   Cleotis Nipper, MD    Family History Family History  Problem Relation Age of Onset  . Alcohol abuse Brother   . Alcohol abuse Sister   . Alcohol abuse Maternal Grandfather     Social History Social History  Substance Use Topics  . Smoking status: Current Every Day Smoker    Packs/day: 0.25    Years: 20.00    Types: Cigarettes  . Smokeless tobacco: Never Used  . Alcohol use 4.8 oz/week    8 Cans of beer per week     Allergies   Review of patient's allergies indicates no known allergies.   Review of Systems Review of Systems  All other systems reviewed and are negative.    Physical Exam Updated Vital Signs BP 134/85 (BP Location: Right Arm)   Pulse 98   Temp 98.3 F (36.8 C) (Oral)   Resp 14   Ht 5\' 7"  (1.702 m)   Wt 210 lb (95.3  kg)   SpO2 93%   BMI 32.89 kg/m   Physical Exam  Constitutional: He is oriented to person, place, and time. He appears well-developed and well-nourished.  HENT:  Head: Normocephalic and atraumatic.  Eyes: EOM are normal.  Neck: Normal range of motion.  Cardiovascular: Normal rate, regular rhythm, normal heart sounds and intact distal pulses.   Pulmonary/Chest: Effort normal and breath sounds normal. No respiratory distress.  Abdominal: Soft. He exhibits no distension. There is no tenderness.  Musculoskeletal: Normal range of motion.  Neurological: He is alert and oriented to person, place, and time.  Skin: Skin is warm and dry.  Psychiatric: He has a normal mood and affect. Judgment normal.  Nursing note and vitals reviewed.    ED Treatments / Results  Labs (all labs ordered are listed, but only abnormal results are displayed) Labs Reviewed - No data to display  EKG  EKG Interpretation None       Radiology No results found.  Procedures Procedures (including critical care time)  Medications Ordered in ED Medications - No data to display   Initial Impression / Assessment and Plan / ED Course  I have reviewed the triage vital signs and the nursing notes.  Pertinent labs & imaging results that were available during my care of the patient were reviewed by me and considered in my medical decision making (see chart for details).  Clinical Course    He'll be discharged when clinically sober.  The patient does not appear to be a threat to himself or others.  Vas that he reach out to his AA sponsor and involve himself back with AA.  Final Clinical Impressions(s) / ED Diagnoses   Final diagnoses:  Alcohol intoxication, uncomplicated [facility])    New Prescriptions New Prescriptions   No medications on file     Azalia Bilis, MD 07/09/16 [phone]

## 2016-07-09 NOTE — ED Provider Notes (Signed)
[facility]-EMERGENCY DEPT Provider Note   CSN: [id number] Arrival date & time: 07/09/16  1238     History   Chief Complaint Chief Complaint  Patient presents with  . Alcohol Intoxication  . Drug Overdose    HPI Marvin Cooper is a 45 y.o. male.  He presents after a possible ingestion of Klonopin and alcohol use at home. Patient states that he "took a new burn" as to how he got here. He was here last night or early this morning intoxicated. He was discharged. He probably drank again and states he took "3 Klonopin". Probably he had a coworker that recently committed suicide. He states he's "not feeling well". He states that this was not a suicide attempt. When asking what he wanted to see happen when he came to the hospital, he states "I just wanted to chill and and have some zen".  HPI  Past Medical History:  Diagnosis Date  . Alcohol abuse   . Asthma   . Hypercholesteremia   . Hypertension     Patient Active Problem List   Diagnosis Date Noted  . Seizure (HCC) 06/30/2015  . Seizure-like activity (HCC) 06/30/2015  . Scalp laceration 06/30/2015  . Alcohol dependence (HCC) 10/16/2014  . Major depression, recurrent (HCC) 10/16/2014    Past Surgical History:  Procedure Laterality Date  . ADENOIDECTOMY  Age 1  . KNEE SURGERY    . LEG SURGERY Left Age 42   ORIF lower leg  . SHOULDER ARTHROSCOPY         Home Medications    Prior to Admission medications   Medication Sig Start Date End Date Taking? Authorizing Provider  albuterol (PROVENTIL HFA;VENTOLIN HFA) 108 (90 BASE) MCG/ACT inhaler Inhale 2 puffs into the lungs every 4 (four) hours as needed for wheezing or shortness of breath. 10/18/14   Sanjuana Kava, NP  benazepril (LOTENSIN) 40 MG tablet Take 1 tablet (40 mg total) by mouth daily. For high blood pressure control 10/18/14   Sanjuana Kava, NP  buPROPion (WELLBUTRIN XL) 300 MG 24 hr tablet Take 1 tablet (300 mg total) by mouth daily. For depression 11/28/14   Cleotis Nipper, MD  citalopram (CELEXA) 20 MG tablet Take 1 tablet (20 mg total) by mouth daily. For depression 11/28/14   Cleotis Nipper, MD  Fenofibrate 150 MG CAPS Take 1 capsule (150 mg total) by mouth daily. For cholesterol control 10/18/14   Sanjuana Kava, NP  hydrOXYzine (VISTARIL) 25 MG capsule Take 1 capsule (25 mg total) by mouth as needed for anxiety. Patient not taking: Reported on 06/30/2015 11/28/14   Cleotis Nipper, MD  loratadine (CLARITIN) 10 MG tablet Take 1 tablet (10 mg total) by mouth daily. For allergies 10/18/14   Sanjuana Kava, NP  niacin (NIASPAN) 500 MG CR tablet Take 500 mg by mouth at bedtime.  10/21/14   Historical Provider, MD  omega-3 acid ethyl esters (LOVAZA) 1 G capsule Take 1 g by mouth 2 (two) times daily.  10/21/14   Historical Provider, MD  simvastatin (ZOCOR) 40 MG tablet Take 1 tablet (40 mg total) by mouth daily. For high cholesterol control 10/18/14   Sanjuana Kava, NP  traZODone (DESYREL) 100 MG tablet Take 1 tablet (100 mg total) by mouth at bedtime and may repeat dose one time if needed. For sleep Patient taking differently: Take 100 mg by mouth at bedtime as needed. For sleep 11/28/14   Cleotis Nipper, MD    Family  History Family History  Problem Relation Age of Onset  . Alcohol abuse Brother   . Alcohol abuse Sister   . Alcohol abuse Maternal Grandfather     Social History Social History  Substance Use Topics  . Smoking status: Current Every Day Smoker    Packs/day: 0.25    Years: 20.00    Types: Cigarettes  . Smokeless tobacco: Never Used  . Alcohol use 4.8 oz/week    8 Cans of beer per week     Allergies   Review of patient's allergies indicates no known allergies.   Review of Systems Review of Systems  Constitutional: Negative for appetite change, chills, diaphoresis, fatigue and fever.  HENT: Negative for mouth sores, sore throat and trouble swallowing.   Eyes: Negative for visual disturbance.  Respiratory: Negative for cough, chest tightness,  shortness of breath and wheezing.   Cardiovascular: Negative for chest pain.  Gastrointestinal: Negative for abdominal distention, abdominal pain, diarrhea, nausea and vomiting.  Endocrine: Negative for polydipsia, polyphagia and polyuria.  Genitourinary: Negative for dysuria, frequency and hematuria.  Musculoskeletal: Negative for gait problem.  Skin: Negative for color change, pallor and rash.  Neurological: Negative for dizziness, syncope, light-headedness and headaches.  Hematological: Does not bruise/bleed easily.  Psychiatric/Behavioral: Positive for dysphoric mood. Negative for behavioral problems and confusion.     Physical Exam Updated Vital Signs BP 134/79 (BP Location: Left Arm)   Pulse 106   Temp 97.9 F (36.6 C) (Oral)   Resp 22   SpO2 93%   Physical Exam  Constitutional: He is oriented to person, place, and time. He appears well-developed and well-nourished. No distress.  HENT:  Head: Normocephalic.  Eyes: Conjunctivae are normal. Pupils are equal, round, and reactive to light. No scleral icterus.  Neck: Normal range of motion. Neck supple. No thyromegaly present.  Cardiovascular: Normal rate and regular rhythm.  Exam reveals no gallop and no friction rub.   No murmur heard. Pulmonary/Chest: Effort normal and breath sounds normal. No respiratory distress. He has no wheezes. He has no rales.  Abdominal: Soft. Bowel sounds are normal. He exhibits no distension. There is no tenderness. There is no rebound.  Musculoskeletal: Normal range of motion.  Neurological: He is alert and oriented to person, place, and time.  Skin: Skin is warm and dry. No rash noted.  Psychiatric: His speech is tangential. He is slowed. He exhibits a depressed mood. He is inattentive.     ED Treatments / Results  Labs (all labs ordered are listed, but only abnormal results are displayed) Labs Reviewed  COMPREHENSIVE METABOLIC PANEL - Abnormal; Notable for the following:       Result Value    Glucose, Bld 117 (*)    All other components within normal limits  ETHANOL - Abnormal; Notable for the following:    Alcohol, Ethyl (B) 217 (*)    All other components within normal limits  ACETAMINOPHEN LEVEL - Abnormal; Notable for the following:    Acetaminophen (Tylenol), Serum <10 (*)    All other components within normal limits  CBC - Abnormal; Notable for the following:    WBC 12.8 (*)    All other components within normal limits  ACETAMINOPHEN LEVEL - Abnormal; Notable for the following:    Acetaminophen (Tylenol), Serum <10 (*)    All other components within normal limits  CBG MONITORING, ED - Abnormal; Notable for the following:    Glucose-Capillary 112 (*)    All other components within normal limits  SALICYLATE  LEVEL  URINE RAPID DRUG SCREEN, HOSP PERFORMED    EKG  EKG Interpretation None       Radiology No results found.  Procedures Procedures (including critical care time)  Medications Ordered in ED Medications  alum & mag hydroxide-simeth (MAALOX/MYLANTA) 200-200-20 MG/5ML suspension 30 mL (not administered)  ondansetron (ZOFRAN) tablet 4 mg (not administered)  nicotine (NICODERM CQ - dosed in mg/24 hours) patch 21 mg (not administered)  acetaminophen (TYLENOL) tablet 650 mg (not administered)  albuterol (PROVENTIL HFA;VENTOLIN HFA) 108 (90 Base) MCG/ACT inhaler 2 puff (not administered)  benazepril (LOTENSIN) tablet 40 mg (not administered)  buPROPion (WELLBUTRIN XL) 24 hr tablet 300 mg (not administered)  citalopram (CELEXA) tablet 20 mg (not administered)  fenofibrate tablet 54 mg (not administered)  hydrOXYzine (VISTARIL) capsule 25 mg (not administered)  loratadine (CLARITIN) tablet 10 mg (not administered)  niacin (NIASPAN) CR tablet 500 mg (not administered)  omega-3 acid ethyl esters (LOVAZA) capsule 1 g (not administered)  simvastatin (ZOCOR) tablet 40 mg (not administered)     Initial Impression / Assessment and Plan / ED Course  I  have reviewed the triage vital signs and the nursing notes.  Pertinent labs & imaging results that were available during my care of the patient were reviewed by me and considered in my medical decision making (see chart for details).  Clinical Course    History date. He does not require intervention. He is protecting his airway. Yes or psychiatric evaluation. His mental status slowly improves. Psychiatric evaluation has requested reevaluation upon sobriety. Will be reassessed in the a.m.  Final Clinical Impressions(s) / ED Diagnoses   Final diagnoses:  Alcohol abuse  Depression    New Prescriptions New Prescriptions   No medications on file     Rolland PorterMark Kaytelynn Scripter, MD 07/09/16 1705

## 2016-07-09 NOTE — ED Notes (Signed)
Pt repositioned upright from laying position. Oxygen saturation remains stable 88-90% on RA. 2 lpm [place] applied.

## 2016-07-09 NOTE — ED Notes (Signed)
Patient noted in room. No complaints, stable, in no acute distress. Q15 minute rounds and monitoring via [employer] Cam[employer] to continue.  

## 2016-07-10 DIAGNOSIS — F1014 Alcohol abuse with alcohol-induced mood disorder: Secondary | ICD-10-CM | POA: Diagnosis present

## 2016-07-10 NOTE — ED Notes (Signed)
Patient noted sleeping in room. No complaints, stable, in no acute distress. Q15 minute rounds and monitoring via Security Cameras to continue.  

## 2016-07-10 NOTE — ED Notes (Signed)
Up tot he bathroom to shower and change scrubs 

## 2016-07-10 NOTE — ED Notes (Addendum)
Written dc instructions reviewed with pt.  Pt denies si/hi/avh on dc.  Pt encouraged to follow up with his PMD.  Pt encouraged to contact his AA sponsor, and attend his AA meeting to night as he has planned.  Pt also encouraged to seek to seek treatment for any return of suicidal thoughts/urges.  Pt verbalized understanding.  Pt ambulatory w/o difficulty to dc area, belongings returned after leaving the area.

## 2016-07-10 NOTE — [facility] Suicide Risk Assessment (Signed)
Suicide Risk Assessment  Discharge Assessment   [facility]   Principal Problem: Alcohol abuse with alcohol-induced mood disorder [facility](HCC) Discharge Diagnoses:  Patient Active Problem List   Diagnosis Date Noted  . Alcohol abuse with alcohol-induced mood disorder (HCC) [F10.14] 07/10/2016    Priority: High  . Alcohol dependence (HCC) [F10.20] 10/16/2014    Priority: High  . Seizure (HCC) [R56.9] 06/30/2015  . Seizure-like activity (HCC) [R56.9] 06/30/2015  . Scalp laceration [S01.01XA] 06/30/2015  . Major depression, recurrent (HCC) [F33.9] 10/16/2014    Total Time spent with patient: 45 minutes  Musculoskeletal: Strength & Muscle Tone: within normal limits Gait & Station: normal Patient leans: N/A  Psychiatric Specialty Exam: Physical Exam  Constitutional: He is oriented to person, place, and time. He appears well-developed and well-nourished.  HENT:  Head: Normocephalic.  Neck: Normal range of motion.  Respiratory: Effort normal.  Musculoskeletal: Normal range of motion.  Neurological: He is alert and oriented to person, place, and time.  Skin: Skin is warm and dry.  Psychiatric: His speech is normal and behavior is normal. Judgment and thought content normal. Cognition and memory are normal. He exhibits a depressed mood.    Review of Systems  Constitutional: Negative.   HENT: Negative.   Eyes: Negative.   Respiratory: Negative.   Cardiovascular: Negative.   Gastrointestinal: Negative.   Genitourinary: Negative.   Musculoskeletal: Negative.   Skin: Negative.   Neurological: Negative.   Endo/Heme/Allergies: Negative.   Psychiatric/Behavioral: Positive for depression and substance abuse.    Blood pressure 126/69, pulse 87, temperature 98.6 F (37 C), temperature source Oral, resp. rate 16, SpO2 97 %.There is no height or weight on file to calculate BMI.  General Appearance: Casual  Eye Contact:  Good  Speech:  Normal Rate  Volume:   Normal  Mood:  Depressed, mild  Affect:  Congruent  Thought Process:  Coherent and Descriptions of Associations: Intact  Orientation:  Full (Time, Place, and Person)  Thought Content:  WDL  Suicidal Thoughts:  No  Homicidal Thoughts:  No  Memory:  Immediate;   Good Recent;   Good Remote;   Good  Judgement:  Fair  Insight:  Fair  Psychomotor Activity:  Normal  Concentration:  Concentration: Good and Attention Span: Good  Recall:  Good  Fund of Knowledge:  Good  Language:  Good  Akathisia:  No  Handed:  Right  AIMS (if indicated):     Assets:  Housing Intimacy Leisure Time Physical Health Resilience Social Support  ADL's:  Intact  Cognition:  WNL  Sleep:      Mental Status Per Nursing Assessment::   On Admission:   alcohol abuse  Demographic Factors:  Male and Caucasian  Loss Factors: NA  Historical Factors: NA  Risk Reduction Factors:   Sense of responsibility to family, Living with another person, especially a relative, Positive social support and Positive therapeutic relationship  Continued Clinical Symptoms:  Depression, mild  Cognitive Features That Contribute To Risk:  None    Suicide Risk:  Minimal: No identifiable suicidal ideation.  Patients presenting with no risk factors but with morbid ruminations; may be classified as minimal risk based on the severity of the depressive symptoms    Plan Of Care/Follow-up recommendations:  Activity:  as tolerated Diet:  heart healthy diet  Ayesha Markwell, NP 07/10/2016, 11:11 AM

## 2016-07-10 NOTE — ED Notes (Signed)
On the phone 

## 2016-07-10 NOTE — Consult Note (Signed)
[facility] Consult   Reason for Consult:  Alcohol abuse  Referring Physician:  EDP Patient Identification: Marvin Cooper MRN:  [id number] Principal Diagnosis: Alcohol abuse with alcohol-induced mood disorder [facility]) Diagnosis:   Patient Active Problem List   Diagnosis Date Noted  . Alcohol abuse with alcohol-induced mood disorder ([place]) [F10.14] 07/10/2016    Priority: High  . Alcohol dependence ([place]) [F10.20] 10/16/2014    Priority: High  . Seizure ([place]) [R56.9] 06/30/2015  . Seizure-like activity ([place]) [R56.9] 06/30/2015  . Scalp laceration [S01.01XA] 06/30/2015  . Major depression, recurrent ([place]) [F33.9] 10/16/2014    Total Time spent with patient: 45 minutes  Subjective:   Marvin Cooper is a 45 y.o. male patient does not warrant admission.  HPI:  45 yo male who presented to the ED with alcohol intoxication and depression due to finding out a friend of his shot and killed himself.  Today, he is clear and coherent.  [name] wants to leave and go to his AA meeting after calling his sponsor, lives with his supportive wife.  He reports finding out about his friend's death is the reason he started drinking again but wants to start recovery again.  Denies suicidal/homicidal ideations, hallucinations, and alcohol/drug withdrawal.  He does have a Klonopin prescription at home for anxiety, educated about more appropriate choices for anxiety due to addictive qualities of benzos.  Advised to take one, if he experiences any withdrawal symptoms.  Past Psychiatric History: alcohol dependence  Risk to Self: Suicidal Ideation: No Suicidal Intent: No Is patient at risk for suicide?: No Suicidal Plan?: No Access to Means: No What has been your use of drugs/alcohol within the last 12 months?: Current use How many times?: 0 Other Self Harm Risks: none Triggers for Past Attempts: Unknown Intentional Self Injurious Behavior: None Risk to Others: Homicidal Ideation: No Thoughts of  Harm to Others: No Current Homicidal Intent: No Current Homicidal Plan: No Access to Homicidal Means: No Identified Victim: na History of harm to others?: No Assessment of Violence: None Noted Violent Behavior Description: na Does patient have access to weapons?: No Criminal Charges Pending?: No Does patient have a court date: No Prior Inpatient Therapy: Prior Inpatient Therapy: Yes Prior Therapy Dates: 2016 Prior Therapy Facilty/Provider(s): [facility] Reason for Treatment: SA issues Prior Outpatient Therapy: Prior Outpatient Therapy: Yes Prior Therapy Dates: 2017 Prior Therapy Facilty/Provider(s): [facility] Reason for Treatment: SA issues Does patient have an ACCT team?: No Does patient have Intensive In-House Services?  : No Does patient have [facility] services? : No Does patient have P4CC services?: No  Past Medical History:  Past Medical History:  Diagnosis Date  . Alcohol abuse   . Asthma   . Hypercholesteremia   . Hypertension     Past Surgical History:  Procedure Laterality Date  . ADENOIDECTOMY  Age 12  . KNEE SURGERY    . LEG SURGERY Left Age 22   ORIF lower leg  . SHOULDER ARTHROSCOPY     Family History:  Family History  Problem Relation Age of Onset  . Alcohol abuse Brother   . Alcohol abuse Sister   . Alcohol abuse Maternal Grandfather    Family Psychiatric  History: none Social History:  History  Alcohol Use  . 4.8 oz/week  . 8 Cans of beer per week     History  Drug Use  . Types: Marijuana    Social History   Social History  . Marital status: Single    Spouse  name: N/A  . Number of children: 2  . Years of education: N/A   Occupational History  .  [employer]   Social History Main Topics  . Smoking status: Current Every Day Smoker    Packs/day: 0.25    Years: 20.00    Types: Cigarettes  . Smokeless tobacco: Never Used  . Alcohol use 4.8 oz/week    8 Cans of beer per week  . Drug use:     Types: Marijuana  . Sexual  activity: Yes    Partners: Female   Other Topics Concern  . None   Social History Narrative   Grew up in [place]. Moved to [place] in 1997. High school graduate.  Trade school education in welding. Married twice, divorced once. Son by first marriage, daughter by second marriage. He enjoys riding Scientist, research (life sciences).   Additional Social History:    Allergies:  No Known Allergies  Labs:  Results for orders placed or performed during the hospital encounter of 07/09/16 (from the past 48 hour(s))  Comprehensive metabolic panel     Status: Abnormal   Collection Time: 07/09/16  1:07 PM  Result Value Ref Range   Sodium 142 135 - 145 mmol/L   Potassium 3.6 3.5 - 5.1 mmol/L   Chloride 103 101 - 111 mmol/L   CO2 30 22 - 32 mmol/L   Glucose, Bld 117 (H) 65 - 99 mg/dL   BUN 9 6 - 20 mg/dL   Creatinine, Ser 0.84 0.61 - 1.24 mg/dL   Calcium 8.9 8.9 - 10.3 mg/dL   Total Protein 7.1 6.5 - 8.1 g/dL   Albumin 4.3 3.5 - 5.0 g/dL   AST 30 15 - 41 U/L   ALT 35 17 - 63 U/L   Alkaline Phosphatase 79 38 - 126 U/L   Total Bilirubin 0.4 0.3 - 1.2 mg/dL   GFR calc non Af Amer >60 >60 mL/min   GFR calc Af Amer >60 >60 mL/min    Comment: (NOTE) The eGFR has been calculated using the CKD EPI equation. This calculation has not been validated in all clinical situations. eGFR's persistently <60 mL/min signify possible Chronic Kidney Disease.    Anion gap 9 5 - 15  cbc     Status: Abnormal   Collection Time: 07/09/16  1:07 PM  Result Value Ref Range   WBC 12.8 (H) 4.0 - 10.5 K/uL   RBC 5.23 4.22 - 5.81 MIL/uL   Hemoglobin 16.2 13.0 - 17.0 g/dL   HCT 47.2 39.0 - 52.0 %   MCV 90.2 78.0 - 100.0 fL   MCH 31.0 26.0 - 34.0 pg   MCHC 34.3 30.0 - 36.0 g/dL   RDW 15.0 11.5 - 15.5 %   Platelets 219 150 - 400 K/uL  Ethanol     Status: Abnormal   Collection Time: 07/09/16  1:08 PM  Result Value Ref Range   Alcohol, Ethyl (B) 217 (H) <5 mg/dL    Comment:        LOWEST DETECTABLE LIMIT FOR SERUM  ALCOHOL IS 5 mg/dL FOR MEDICAL PURPOSES ONLY   Salicylate level     Status: None   Collection Time: 07/09/16  1:08 PM  Result Value Ref Range   Salicylate Lvl <6.5 2.8 - 30.0 mg/dL  Acetaminophen level     Status: Abnormal   Collection Time: 07/09/16  1:08 PM  Result Value Ref Range   Acetaminophen (Tylenol), Serum <10 (L) 10 - 30 ug/mL  Comment:        THERAPEUTIC CONCENTRATIONS VARY SIGNIFICANTLY. A RANGE OF 10-30 ug/mL MAY BE AN EFFECTIVE CONCENTRATION FOR MANY PATIENTS. HOWEVER, SOME ARE BEST TREATED AT CONCENTRATIONS OUTSIDE THIS RANGE. ACETAMINOPHEN CONCENTRATIONS >150 ug/mL AT 4 HOURS AFTER INGESTION AND >50 ug/mL AT 12 HOURS AFTER INGESTION ARE OFTEN ASSOCIATED WITH TOXIC REACTIONS.   CBG monitoring, ED     Status: Abnormal   Collection Time: 07/09/16  1:08 PM  Result Value Ref Range   Glucose-Capillary 112 (H) 65 - 99 mg/dL  Rapid urine drug screen (hospital performed)     Status: None   Collection Time: 07/09/16  1:23 PM  Result Value Ref Range   Opiates NONE DETECTED NONE DETECTED   Cocaine NONE DETECTED NONE DETECTED   Benzodiazepines NONE DETECTED NONE DETECTED   Amphetamines NONE DETECTED NONE DETECTED   Tetrahydrocannabinol NONE DETECTED NONE DETECTED   Barbiturates NONE DETECTED NONE DETECTED    Comment:        DRUG SCREEN FOR MEDICAL PURPOSES ONLY.  IF CONFIRMATION IS NEEDED FOR ANY PURPOSE, NOTIFY LAB WITHIN 5 DAYS.        LOWEST DETECTABLE LIMITS FOR URINE DRUG SCREEN Drug Class       Cutoff (ng/mL) Amphetamine      1000 Barbiturate      200 Benzodiazepine   034 Tricyclics       742 Opiates          300 Cocaine          300 THC              50   Acetaminophen level     Status: Abnormal   Collection Time: 07/09/16  3:49 PM  Result Value Ref Range   Acetaminophen (Tylenol), Serum <10 (L) 10 - 30 ug/mL    Comment:        THERAPEUTIC CONCENTRATIONS VARY SIGNIFICANTLY. A RANGE OF 10-30 ug/mL MAY BE AN EFFECTIVE CONCENTRATION FOR MANY  PATIENTS. HOWEVER, SOME ARE BEST TREATED AT CONCENTRATIONS OUTSIDE THIS RANGE. ACETAMINOPHEN CONCENTRATIONS >150 ug/mL AT 4 HOURS AFTER INGESTION AND >50 ug/mL AT 12 HOURS AFTER INGESTION ARE OFTEN ASSOCIATED WITH TOXIC REACTIONS.     Current Facility-Administered Medications  Medication Dose Route Frequency Provider Last Rate Last Dose  . acetaminophen (TYLENOL) tablet 650 mg  650 mg Oral Q4H PRN Tanna Furry, MD   650 mg at 07/09/16 1835  . albuterol (PROVENTIL HFA;VENTOLIN HFA) 108 (90 Base) MCG/ACT inhaler 2 puff  2 puff Inhalation Q4H PRN Tanna Furry, MD      . alum & mag hydroxide-simeth (MAALOX/MYLANTA) 200-200-20 MG/5ML suspension 30 mL  30 mL Oral PRN Tanna Furry, MD      . benazepril (LOTENSIN) tablet 40 mg  40 mg Oral Daily Tanna Furry, MD   40 mg at 07/10/16 0913  . buPROPion (WELLBUTRIN XL) 24 hr tablet 300 mg  300 mg Oral Daily Tanna Furry, MD   300 mg at 07/10/16 0912  . citalopram (CELEXA) tablet 20 mg  20 mg Oral Daily Tanna Furry, MD   20 mg at 07/10/16 0912  . fenofibrate tablet 160 mg  160 mg Oral Daily Tanna Furry, MD   160 mg at 07/10/16 0913  . hydrOXYzine (ATARAX/VISTARIL) tablet 25 mg  25 mg Oral Q4H PRN Tanna Furry, MD      . loratadine (CLARITIN) tablet 10 mg  10 mg Oral Daily Tanna Furry, MD   10 mg at 07/10/16 0912  . LORazepam (ATIVAN) tablet 1  mg  1 mg Oral Q6H PRN Patrecia Pour, NP      . niacin (NIASPAN) CR tablet 500 mg  500 mg Oral QHS Tanna Furry, MD   500 mg at 07/09/16 2106  . nicotine (NICODERM CQ - dosed in mg/24 hours) patch 21 mg  21 mg Transdermal Daily Tanna Furry, MD   21 mg at 07/10/16 0911  . omega-3 acid ethyl esters (LOVAZA) capsule 1 g  1 g Oral BID Tanna Furry, MD   1 g at 07/10/16 0913  . ondansetron (ZOFRAN) tablet 4 mg  4 mg Oral Q8H PRN Tanna Furry, MD      . simvastatin (ZOCOR) tablet 40 mg  40 mg Oral q1800 Tanna Furry, MD   40 mg at 07/09/16 1836  . traZODone (DESYREL) tablet 50 mg  50 mg Oral QHS PRN Patrecia Pour, NP       Current  Outpatient Prescriptions  Medication Sig Dispense Refill  . albuterol (PROVENTIL HFA;VENTOLIN HFA) 108 (90 BASE) MCG/ACT inhaler Inhale 2 puffs into the lungs every 4 (four) hours as needed for wheezing or shortness of breath.    . amphetamine-dextroamphetamine (ADDERALL XR) 30 MG 24 hr capsule Take 30 mg by mouth 2 (two) times daily with breakfast and lunch.    . amphetamine-dextroamphetamine (ADDERALL) 20 MG tablet Take 20 mg by mouth daily.    . benazepril (LOTENSIN) 40 MG tablet Take 40 mg by mouth daily.    Marland Kitchen buPROPion (WELLBUTRIN XL) 150 MG 24 hr tablet Take 450 mg by mouth daily.    . citalopram (CELEXA) 40 MG tablet Take 40 mg by mouth daily.    . clonazePAM (KLONOPIN) 1 MG tablet Take 1 mg by mouth 2 (two) times daily as needed for anxiety.    . Fenofibrate 150 MG CAPS Take 150 mg by mouth at bedtime.    . gabapentin (NEURONTIN) 800 MG tablet Take 800 mg by mouth 4 (four) times daily.    Marland Kitchen loratadine (CLARITIN) 10 MG tablet Take 10 mg by mouth daily.    Marland Kitchen omega-3 acid ethyl esters (LOVAZA) 1 G capsule Take 1 g by mouth 2 (two) times daily.     . simvastatin (ZOCOR) 40 MG tablet Take 40 mg by mouth at bedtime.    Marland Kitchen testosterone cypionate (DEPOTESTOSTERONE CYPIONATE) 200 MG/ML injection Inject 140 mg into the muscle once a week. Pt uses on Tuesday.    . traZODone (DESYREL) 100 MG tablet Take 100-200 mg by mouth at bedtime as needed for sleep.      Musculoskeletal: Strength & Muscle Tone: within normal limits Gait & Station: normal Patient leans: N/A  Psychiatric Specialty Exam: Physical Exam  Constitutional: He is oriented to person, place, and time. He appears well-developed and well-nourished.  HENT:  Head: Normocephalic.  Neck: Normal range of motion.  Respiratory: Effort normal.  Musculoskeletal: Normal range of motion.  Neurological: He is alert and oriented to person, place, and time.  Skin: Skin is warm and dry.  Psychiatric: His speech is normal and behavior is normal.  Judgment and thought content normal. Cognition and memory are normal. He exhibits a depressed mood.    Review of Systems  Constitutional: Negative.   HENT: Negative.   Eyes: Negative.   Respiratory: Negative.   Cardiovascular: Negative.   Gastrointestinal: Negative.   Genitourinary: Negative.   Musculoskeletal: Negative.   Skin: Negative.   Neurological: Negative.   Endo/Heme/Allergies: Negative.   Psychiatric/Behavioral: Positive for depression and substance abuse.  Blood pressure 126/69, pulse 87, temperature 98.6 F (37 C), temperature source Oral, resp. rate 16, SpO2 97 %.There is no height or weight on file to calculate BMI.  General Appearance: Casual  Eye Contact:  Good  Speech:  Normal Rate  Volume:  Normal  Mood:  Depressed, mild  Affect:  Congruent  Thought Process:  Coherent and Descriptions of Associations: Intact  Orientation:  Full (Time, Place, and Person)  Thought Content:  WDL  Suicidal Thoughts:  No  Homicidal Thoughts:  No  Memory:  Immediate;   Good Recent;   Good Remote;   Good  Judgement:  Fair  Insight:  Fair  Psychomotor Activity:  Normal  Concentration:  Concentration: Good and Attention Span: Good  Recall:  Good  Fund of Knowledge:  Good  Language:  Good  Akathisia:  No  Handed:  Right  AIMS (if indicated):     Assets:  Housing Intimacy Leisure Time Physical Health Resilience Social Support  ADL's:  Intact  Cognition:  WNL  Sleep:        Treatment Plan Summary: Daily contact with patient to assess and evaluate symptoms and progress in treatment, Medication management and Plan alcohol abuse with alcohol induced mood disorder:  -Crisis stabilization -Medication management:  Medical medications restarted along with Wellbutrin 300 mg daily for depression, Celexa 20 mg  Daily for depression vs his reported 40 mg due to cardiovascular incidences.  Started Trazodone 50 mg at bedtime PRN sleep.  Did not restarted Adderall in ED, no need to  concentrate, or his reported Klonopin due to addiction.   -Individual and substance abuse counseling  Disposition: No evidence of imminent risk to self or others at present.    Waylan Boga, NP 07/10/2016 10:37 AM  Patient seen face-to-face for psychiatric evaluation, chart reviewed and case discussed with the physician extender and developed treatment plan. Reviewed the information documented and agree with the treatment plan. Corena Pilgrim, MD

## 2016-07-11 ENCOUNTER — Emergency Department (HOSPITAL_COMMUNITY)
Admission: EM | Admit: 2016-07-11 | Discharge: 2016-07-11 | Disposition: A | Discharge status: Home / Self Care | Payer: BLUE CROSS/BLUE SHIELD | Attending: Emergency Medicine | Admitting: Emergency Medicine

## 2016-07-11 ENCOUNTER — Encounter (HOSPITAL_COMMUNITY): Payer: Self-pay | Admitting: Emergency Medicine

## 2016-07-11 DIAGNOSIS — F17210 Nicotine dependence, cigarettes, uncomplicated: Secondary | ICD-10-CM | POA: Insufficient documentation

## 2016-07-11 DIAGNOSIS — F419 Anxiety disorder, unspecified: Secondary | ICD-10-CM | POA: Diagnosis not present

## 2016-07-11 DIAGNOSIS — Z79899 Other long term (current) drug therapy: Secondary | ICD-10-CM | POA: Diagnosis not present

## 2016-07-11 DIAGNOSIS — J45909 Unspecified asthma, uncomplicated: Secondary | ICD-10-CM | POA: Diagnosis not present

## 2016-07-11 DIAGNOSIS — F10120 Alcohol abuse with intoxication, uncomplicated: Secondary | ICD-10-CM | POA: Diagnosis not present

## 2016-07-11 DIAGNOSIS — I10 Essential (primary) hypertension: Secondary | ICD-10-CM | POA: Diagnosis not present

## 2016-07-11 DIAGNOSIS — F1290 Cannabis use, unspecified, uncomplicated: Secondary | ICD-10-CM | POA: Diagnosis not present

## 2016-07-11 DIAGNOSIS — F10920 Alcohol use, unspecified with intoxication, uncomplicated: Secondary | ICD-10-CM

## 2016-07-11 LAB — RAPID URINE DRUG SCREEN, HOSP PERFORMED
AMPHETAMINES: NOT DETECTED
BARBITURATES: NOT DETECTED
Benzodiazepines: NOT DETECTED
Cocaine: NOT DETECTED
OPIATES: NOT DETECTED
TETRAHYDROCANNABINOL: NOT DETECTED

## 2016-07-11 LAB — CBC
HEMATOCRIT: 48.2 % (ref 39.0–52.0)
HEMOGLOBIN: 16.2 g/dL (ref 13.0–17.0)
MCH: 31 pg (ref 26.0–34.0)
MCHC: 33.6 g/dL (ref 30.0–36.0)
MCV: 92.3 fL (ref 78.0–100.0)
Platelets: 216 10*3/uL (ref 150–400)
RBC: 5.22 MIL/uL (ref 4.22–5.81)
RDW: 15.2 % (ref 11.5–15.5)
WBC: 14.1 10*3/uL — ABNORMAL HIGH (ref 4.0–10.5)

## 2016-07-11 LAB — COMPREHENSIVE METABOLIC PANEL
ALBUMIN: 4.2 g/dL (ref 3.5–5.0)
ALT: 36 U/L (ref 17–63)
ANION GAP: 9 (ref 5–15)
AST: 27 U/L (ref 15–41)
Alkaline Phosphatase: 74 U/L (ref 38–126)
BILIRUBIN TOTAL: 0.6 mg/dL (ref 0.3–1.2)
BUN: 15 mg/dL (ref 6–20)
CHLORIDE: 106 mmol/L (ref 101–111)
CO2: 24 mmol/L (ref 22–32)
Calcium: 9.2 mg/dL (ref 8.9–10.3)
Creatinine, Ser: 0.79 mg/dL (ref 0.61–1.24)
GFR calc Af Amer: >60 mL/min (ref >60)
GFR calc non Af Amer: >60 mL/min (ref >60)
GLUCOSE: 110 mg/dL — AB (ref 65–99)
POTASSIUM: 3.7 mmol/L (ref 3.5–5.1)
SODIUM: 139 mmol/L (ref 135–145)
TOTAL PROTEIN: 7.1 g/dL (ref 6.5–8.1)

## 2016-07-11 LAB — ETHANOL: Alcohol, Ethyl (B): 164 mg/dL — ABNORMAL HIGH (ref <5)

## 2016-07-11 MED ORDER — LORAZEPAM 1 MG PO TABS: 1.0000 mg | ORAL_TABLET | ORAL | 0 refills | Status: DC | PRN

## 2016-07-11 MED ORDER — LORAZEPAM 1 MG PO TABS
1.0000 mg | ORAL_TABLET | Freq: Once | ORAL | Status: AC
  Administered 2016-07-11: 1 mg via ORAL
  Filled 2016-07-11: qty 1

## 2016-07-11 NOTE — ED Notes (Signed)
Pt states he lost his albuterol inhaler. Pt has clear lung sounds at this time

## 2016-07-11 NOTE — ED Triage Notes (Signed)
Pt from home stating he would like assistance getting detox from [facility]. Pt states that he drank about 1 pint of vodka and 8 beers. [facility] brethalized pt and got .18 Pt states that he recently lost a close friend and has been struggling with his drinking since then. Pt denies SI, HI, AVH. Pt

## 2016-07-11 NOTE — Discharge Instructions (Signed)
Do not drink any alcohol. Take Ativan for anxiety and shakes. Please call behavioral health tomorrow morning, or any other provided resources. Return if any thoughts about hurting herself or anyone else.

## 2016-07-11 NOTE — ED Provider Notes (Signed)
[facility]-EMERGENCY DEPT Provider Note   CSN: [id number] Arrival date & time: 07/11/16  0039     History   Chief Complaint Chief Complaint  Patient presents with  . Alcohol Intoxication    HPI Marvin Cooper is a 45 y.o. male.  HPI Marvin Cooper is a 45 y.o. male history of alcohol abuse and hypertension, presents to emergency department with complaint of wanting detox. Patient states he has been sober for 9 months, states that he recently lost a friend to suicide, and since then has been binge drinking. Patient states he has been drinking for approximately 2-3 days. He states that he wants to go into an inpatient program to help to get treatment for his relapse. Patient denies any suicidal or homicidal ideations. Patient states that he try to not drink, and developed shakes tonight even after 3 days of binging. He does have history of alcohol withdrawal seizures. He states he wants to go to behavioral health. Nothing making his symptoms better or worse. No other associated symptoms.   Past Medical History:  Diagnosis Date  . Alcohol abuse   . Asthma   . Hypercholesteremia   . Hypertension     Patient Active Problem List   Diagnosis Date Noted  . Alcohol abuse with alcohol-induced mood disorder (HCC) 07/10/2016  . Seizure (HCC) 06/30/2015  . Seizure-like activity (HCC) 06/30/2015  . Scalp laceration 06/30/2015  . Alcohol dependence (HCC) 10/16/2014  . Major depression, recurrent (HCC) 10/16/2014    Past Surgical History:  Procedure Laterality Date  . ADENOIDECTOMY  Age 49  . KNEE SURGERY    . LEG SURGERY Left Age 89   ORIF lower leg  . SHOULDER ARTHROSCOPY         Home Medications    Prior to Admission medications   Medication Sig Start Date End Date Taking? Authorizing Provider  albuterol (PROVENTIL HFA;VENTOLIN HFA) 108 (90 BASE) MCG/ACT inhaler Inhale 2 puffs into the lungs every 4 (four) hours as needed for wheezing or shortness of breath. 10/18/14   Sanjuana KavaAgnes I Nwoko,  NP  amphetamine-dextroamphetamine (ADDERALL XR) 30 MG 24 hr capsule Take 30 mg by mouth 2 (two) times daily with breakfast and lunch.    Historical Provider, MD  amphetamine-dextroamphetamine (ADDERALL) 20 MG tablet Take 20 mg by mouth daily.    Historical Provider, MD  benazepril (LOTENSIN) 40 MG tablet Take 40 mg by mouth daily.    Historical Provider, MD  buPROPion (WELLBUTRIN XL) 150 MG 24 hr tablet Take 450 mg by mouth daily.    Historical Provider, MD  citalopram (CELEXA) 40 MG tablet Take 40 mg by mouth daily.    Historical Provider, MD  clonazePAM (KLONOPIN) 1 MG tablet Take 1 mg by mouth 2 (two) times daily as needed for anxiety.    Historical Provider, MD  Fenofibrate 150 MG CAPS Take 150 mg by mouth at bedtime.    Historical Provider, MD  gabapentin (NEURONTIN) 800 MG tablet Take 800 mg by mouth 4 (four) times daily.    Historical Provider, MD  loratadine (CLARITIN) 10 MG tablet Take 10 mg by mouth daily.    Historical Provider, MD  LORazepam (ATIVAN) 1 MG tablet Take 1 tablet (1 mg total) by mouth every 4 (four) hours as needed for anxiety. 07/11/16   Eulala Newcombe, PA-C  omega-3 acid ethyl esters (LOVAZA) 1 G capsule Take 1 g by mouth 2 (two) times daily.     Historical Provider, MD  simvastatin (ZOCOR) 40 MG tablet  Take 40 mg by mouth at bedtime.    Historical Provider, MD  testosterone cypionate (DEPOTESTOSTERONE CYPIONATE) 200 MG/ML injection Inject 140 mg into the muscle once a week. Pt uses on Tuesday.    Historical Provider, MD  traZODone (DESYREL) 100 MG tablet Take 100-200 mg by mouth at bedtime as needed for sleep.    Historical Provider, MD    Family History Family History  Problem Relation Age of Onset  . Alcohol abuse Brother   . Alcohol abuse Sister   . Alcohol abuse Maternal Grandfather     Social History Social History  Substance Use Topics  . Smoking status: Current Every Day Smoker    Packs/day: 0.25    Years: 20.00    Types: Cigarettes  . Smokeless  tobacco: Never Used  . Alcohol use 4.8 oz/week    8 Cans of beer per week     Allergies   Review of patient's allergies indicates no known allergies.   Review of Systems Review of Systems  Constitutional: Negative for chills and fever.  Respiratory: Negative for cough, chest tightness and shortness of breath.   Cardiovascular: Negative for chest pain, palpitations and leg swelling.  Gastrointestinal: Negative for abdominal distention, abdominal pain, diarrhea, nausea and vomiting.  Genitourinary: Negative for dysuria, frequency, hematuria and urgency.  Musculoskeletal: Negative for arthralgias, myalgias, neck pain and neck stiffness.  Skin: Negative for rash.  Allergic/Immunologic: Negative for immunocompromised state.  Neurological: Negative for dizziness, weakness, light-headedness, numbness and headaches.  Psychiatric/Behavioral: The patient is nervous/anxious.   All other systems reviewed and are negative.    Physical Exam Updated Vital Signs BP 137/83 (BP Location: Left Arm)   Pulse 106   Temp 98.2 F (36.8 C) (Oral)   SpO2 91%   Physical Exam  Constitutional: He appears well-developed and well-nourished. No distress.  HENT:  Head: Normocephalic and atraumatic.  Eyes: Conjunctivae are normal.  Neck: Neck supple.  Cardiovascular: Normal rate, regular rhythm and normal heart sounds.   Pulmonary/Chest: Effort normal. No respiratory distress. He has no wheezes. He has no rales.  Abdominal: Soft. Bowel sounds are normal. He exhibits no distension. There is no tenderness. There is no rebound.  Musculoskeletal: He exhibits no edema.  Neurological: He is alert.  Skin: Skin is warm and dry.  Psychiatric: He has a normal mood and affect. His behavior is normal.  Appears intoxicated. Denies SI or HI  Nursing note and vitals reviewed.    ED Treatments / Results  Labs (all labs ordered are listed, but only abnormal results are displayed) Labs Reviewed  COMPREHENSIVE  METABOLIC PANEL - Abnormal; Notable for the following:       Result Value   Glucose, Bld 110 (*)    All other components within normal limits  ETHANOL - Abnormal; Notable for the following:    Alcohol, Ethyl (B) 164 (*)    All other components within normal limits  CBC - Abnormal; Notable for the following:    WBC 14.1 (*)    All other components within normal limits  URINE RAPID DRUG SCREEN, HOSP PERFORMED    EKG  EKG Interpretation None       Radiology No results found.  Procedures Procedures (including critical care time)  Medications Ordered in ED Medications  LORazepam (ATIVAN) tablet 1 mg (1 mg Oral Given 07/11/16 0230)     Initial Impression / Assessment and Plan / ED Course  I have reviewed the triage vital signs and the nursing notes.  Pertinent  labs & imaging results that were available during my care of the patient were reviewed by me and considered in my medical decision making (see chart for details).  Clinical Course    Patient emergency department requesting detox from binge drinking for 2 days after losing a friend. Doubt pt will have DTs or major withdrawal after just drinking for 2 days. Pt however feels very anxious about being discharged. Will give some ativan to take at home. He will call [facility] in the morning. Resources given.   Final Clinical Impressions(s) / ED Diagnoses   Final diagnoses:  Alcohol intoxication, uncomplicated (HCC)    New Prescriptions Discharge Medication List as of 07/11/2016  2:25 AM    START taking these medications   Details  LORazepam (ATIVAN) 1 MG tablet Take 1 tablet (1 mg total) by mouth every 4 (four) hours as needed for anxiety., Starting Mon 07/11/2016, Print         Jaynie Crumble, PA-C 07/11/16 0533    Melene Plan, DO 07/11/16 [phone]

## 2016-07-11 NOTE — ED Notes (Signed)
Bed: WTR6 Expected date:  Expected time:  Means of arrival:  Comments: 

## 2016-07-20 ENCOUNTER — Encounter (HOSPITAL_BASED_OUTPATIENT_CLINIC_OR_DEPARTMENT_OTHER): Payer: Self-pay | Admitting: Emergency Medicine

## 2016-09-16 DIAGNOSIS — F3342 Major depressive disorder, recurrent, in full remission: Secondary | ICD-10-CM | POA: Diagnosis not present

## 2016-09-16 DIAGNOSIS — F410 Panic disorder [episodic paroxysmal anxiety] without agoraphobia: Secondary | ICD-10-CM | POA: Diagnosis not present

## 2016-09-16 DIAGNOSIS — F900 Attention-deficit hyperactivity disorder, predominantly inattentive type: Secondary | ICD-10-CM | POA: Diagnosis not present

## 2016-12-14 DIAGNOSIS — F329 Major depressive disorder, single episode, unspecified: Secondary | ICD-10-CM | POA: Diagnosis not present

## 2016-12-14 DIAGNOSIS — E782 Mixed hyperlipidemia: Secondary | ICD-10-CM | POA: Diagnosis not present

## 2016-12-14 DIAGNOSIS — I10 Essential (primary) hypertension: Secondary | ICD-10-CM | POA: Diagnosis not present

## 2016-12-14 DIAGNOSIS — E291 Testicular hypofunction: Secondary | ICD-10-CM | POA: Diagnosis not present

## 2016-12-14 DIAGNOSIS — J309 Allergic rhinitis, unspecified: Secondary | ICD-10-CM | POA: Diagnosis not present

## 2017-01-17 DIAGNOSIS — M545 Low back pain: Secondary | ICD-10-CM | POA: Diagnosis not present

## 2017-01-24 DIAGNOSIS — L03119 Cellulitis of unspecified part of limb: Secondary | ICD-10-CM | POA: Diagnosis not present

## 2017-03-10 DIAGNOSIS — F3342 Major depressive disorder, recurrent, in full remission: Secondary | ICD-10-CM | POA: Diagnosis not present

## 2017-03-10 DIAGNOSIS — F410 Panic disorder [episodic paroxysmal anxiety] without agoraphobia: Secondary | ICD-10-CM | POA: Diagnosis not present

## 2017-03-10 DIAGNOSIS — F900 Attention-deficit hyperactivity disorder, predominantly inattentive type: Secondary | ICD-10-CM | POA: Diagnosis not present

## 2017-04-26 DIAGNOSIS — K621 Rectal polyp: Secondary | ICD-10-CM | POA: Diagnosis not present

## 2017-04-26 DIAGNOSIS — Z86010 Personal history of colonic polyps: Secondary | ICD-10-CM | POA: Diagnosis not present

## 2017-04-26 DIAGNOSIS — K635 Polyp of colon: Secondary | ICD-10-CM | POA: Diagnosis not present

## 2017-05-02 DIAGNOSIS — L0291 Cutaneous abscess, unspecified: Secondary | ICD-10-CM | POA: Diagnosis not present

## 2017-05-02 DIAGNOSIS — K635 Polyp of colon: Secondary | ICD-10-CM | POA: Diagnosis not present

## 2017-05-02 DIAGNOSIS — L03818 Cellulitis of other sites: Secondary | ICD-10-CM | POA: Diagnosis not present

## 2017-06-08 DIAGNOSIS — E291 Testicular hypofunction: Secondary | ICD-10-CM | POA: Diagnosis not present

## 2017-06-08 DIAGNOSIS — M545 Low back pain: Secondary | ICD-10-CM | POA: Diagnosis not present

## 2017-06-08 DIAGNOSIS — E782 Mixed hyperlipidemia: Secondary | ICD-10-CM | POA: Diagnosis not present

## 2017-06-08 DIAGNOSIS — E559 Vitamin D deficiency, unspecified: Secondary | ICD-10-CM | POA: Diagnosis not present

## 2017-06-08 DIAGNOSIS — I10 Essential (primary) hypertension: Secondary | ICD-10-CM | POA: Diagnosis not present

## 2017-07-19 DIAGNOSIS — M545 Low back pain: Secondary | ICD-10-CM | POA: Diagnosis not present

## 2017-07-19 DIAGNOSIS — G8929 Other chronic pain: Secondary | ICD-10-CM | POA: Diagnosis not present

## 2017-08-08 DIAGNOSIS — M47896 Other spondylosis, lumbar region: Secondary | ICD-10-CM | POA: Diagnosis not present

## 2017-08-08 DIAGNOSIS — M5136 Other intervertebral disc degeneration, lumbar region: Secondary | ICD-10-CM | POA: Diagnosis not present

## 2017-08-08 DIAGNOSIS — M549 Dorsalgia, unspecified: Secondary | ICD-10-CM | POA: Diagnosis not present

## 2017-08-08 DIAGNOSIS — M545 Low back pain: Secondary | ICD-10-CM | POA: Diagnosis not present

## 2017-08-21 DIAGNOSIS — Z716 Tobacco abuse counseling: Secondary | ICD-10-CM | POA: Diagnosis not present

## 2017-08-21 DIAGNOSIS — M47816 Spondylosis without myelopathy or radiculopathy, lumbar region: Secondary | ICD-10-CM | POA: Diagnosis not present

## 2017-08-21 DIAGNOSIS — F17210 Nicotine dependence, cigarettes, uncomplicated: Secondary | ICD-10-CM | POA: Diagnosis not present

## 2017-09-01 DIAGNOSIS — F900 Attention-deficit hyperactivity disorder, predominantly inattentive type: Secondary | ICD-10-CM | POA: Diagnosis not present

## 2017-09-01 DIAGNOSIS — F331 Major depressive disorder, recurrent, moderate: Secondary | ICD-10-CM | POA: Diagnosis not present

## 2017-10-09 DIAGNOSIS — M47816 Spondylosis without myelopathy or radiculopathy, lumbar region: Secondary | ICD-10-CM | POA: Diagnosis not present

## 2017-10-17 DIAGNOSIS — I10 Essential (primary) hypertension: Secondary | ICD-10-CM | POA: Diagnosis not present

## 2017-10-17 DIAGNOSIS — E782 Mixed hyperlipidemia: Secondary | ICD-10-CM | POA: Diagnosis not present

## 2017-10-17 DIAGNOSIS — F419 Anxiety disorder, unspecified: Secondary | ICD-10-CM | POA: Diagnosis not present

## 2017-10-17 DIAGNOSIS — Z23 Encounter for immunization: Secondary | ICD-10-CM | POA: Diagnosis not present

## 2017-10-17 DIAGNOSIS — E559 Vitamin D deficiency, unspecified: Secondary | ICD-10-CM | POA: Diagnosis not present

## 2017-10-17 DIAGNOSIS — E291 Testicular hypofunction: Secondary | ICD-10-CM | POA: Diagnosis not present

## 2017-10-31 DIAGNOSIS — L738 Other specified follicular disorders: Secondary | ICD-10-CM | POA: Diagnosis not present

## 2017-10-31 DIAGNOSIS — D2239 Melanocytic nevi of other parts of face: Secondary | ICD-10-CM | POA: Diagnosis not present

## 2017-11-03 DIAGNOSIS — F900 Attention-deficit hyperactivity disorder, predominantly inattentive type: Secondary | ICD-10-CM | POA: Diagnosis not present

## 2017-11-03 DIAGNOSIS — F3342 Major depressive disorder, recurrent, in full remission: Secondary | ICD-10-CM | POA: Diagnosis not present

## 2018-01-09 DIAGNOSIS — G8929 Other chronic pain: Secondary | ICD-10-CM | POA: Diagnosis not present

## 2018-01-09 DIAGNOSIS — M47816 Spondylosis without myelopathy or radiculopathy, lumbar region: Secondary | ICD-10-CM | POA: Diagnosis not present

## 2018-01-09 DIAGNOSIS — Z720 Tobacco use: Secondary | ICD-10-CM | POA: Diagnosis not present

## 2018-04-10 DIAGNOSIS — J45909 Unspecified asthma, uncomplicated: Secondary | ICD-10-CM | POA: Diagnosis not present

## 2018-04-10 DIAGNOSIS — G8929 Other chronic pain: Secondary | ICD-10-CM | POA: Diagnosis not present

## 2018-04-10 DIAGNOSIS — J309 Allergic rhinitis, unspecified: Secondary | ICD-10-CM | POA: Diagnosis not present

## 2018-07-11 DIAGNOSIS — E291 Testicular hypofunction: Secondary | ICD-10-CM | POA: Diagnosis not present

## 2018-07-11 DIAGNOSIS — E559 Vitamin D deficiency, unspecified: Secondary | ICD-10-CM | POA: Diagnosis not present

## 2018-07-11 DIAGNOSIS — M47816 Spondylosis without myelopathy or radiculopathy, lumbar region: Secondary | ICD-10-CM | POA: Diagnosis not present

## 2018-07-11 DIAGNOSIS — G8929 Other chronic pain: Secondary | ICD-10-CM | POA: Diagnosis not present

## 2018-10-15 DIAGNOSIS — M47816 Spondylosis without myelopathy or radiculopathy, lumbar region: Secondary | ICD-10-CM | POA: Diagnosis not present

## 2018-10-15 DIAGNOSIS — G8929 Other chronic pain: Secondary | ICD-10-CM | POA: Diagnosis not present

## 2018-10-19 DIAGNOSIS — F900 Attention-deficit hyperactivity disorder, predominantly inattentive type: Secondary | ICD-10-CM | POA: Diagnosis not present

## 2018-10-19 DIAGNOSIS — F3342 Major depressive disorder, recurrent, in full remission: Secondary | ICD-10-CM | POA: Diagnosis not present

## 2019-01-17 DIAGNOSIS — I10 Essential (primary) hypertension: Secondary | ICD-10-CM | POA: Diagnosis not present

## 2019-01-17 DIAGNOSIS — E291 Testicular hypofunction: Secondary | ICD-10-CM | POA: Diagnosis not present

## 2019-01-17 DIAGNOSIS — E782 Mixed hyperlipidemia: Secondary | ICD-10-CM | POA: Diagnosis not present

## 2019-01-17 DIAGNOSIS — G8929 Other chronic pain: Secondary | ICD-10-CM | POA: Diagnosis not present

## 2019-01-17 DIAGNOSIS — M47816 Spondylosis without myelopathy or radiculopathy, lumbar region: Secondary | ICD-10-CM | POA: Diagnosis not present

## 2019-04-19 DIAGNOSIS — F4010 Social phobia, unspecified: Secondary | ICD-10-CM | POA: Diagnosis not present

## 2019-04-19 DIAGNOSIS — F3342 Major depressive disorder, recurrent, in full remission: Secondary | ICD-10-CM | POA: Diagnosis not present

## 2019-04-19 DIAGNOSIS — F900 Attention-deficit hyperactivity disorder, predominantly inattentive type: Secondary | ICD-10-CM | POA: Diagnosis not present

## 2019-04-25 DIAGNOSIS — Z125 Encounter for screening for malignant neoplasm of prostate: Secondary | ICD-10-CM | POA: Diagnosis not present

## 2019-04-25 DIAGNOSIS — E782 Mixed hyperlipidemia: Secondary | ICD-10-CM | POA: Diagnosis not present

## 2019-04-25 DIAGNOSIS — G8929 Other chronic pain: Secondary | ICD-10-CM | POA: Diagnosis not present

## 2019-04-25 DIAGNOSIS — M47816 Spondylosis without myelopathy or radiculopathy, lumbar region: Secondary | ICD-10-CM | POA: Diagnosis not present

## 2019-04-25 DIAGNOSIS — E291 Testicular hypofunction: Secondary | ICD-10-CM | POA: Diagnosis not present

## 2019-04-25 DIAGNOSIS — I10 Essential (primary) hypertension: Secondary | ICD-10-CM | POA: Diagnosis not present

## 2019-04-25 DIAGNOSIS — R7301 Impaired fasting glucose: Secondary | ICD-10-CM | POA: Diagnosis not present

## 2019-07-24 DIAGNOSIS — G8929 Other chronic pain: Secondary | ICD-10-CM | POA: Diagnosis not present

## 2019-07-24 DIAGNOSIS — M47816 Spondylosis without myelopathy or radiculopathy, lumbar region: Secondary | ICD-10-CM | POA: Diagnosis not present

## 2019-07-24 DIAGNOSIS — E291 Testicular hypofunction: Secondary | ICD-10-CM | POA: Diagnosis not present

## 2019-10-01 DIAGNOSIS — H6592 Unspecified nonsuppurative otitis media, left ear: Secondary | ICD-10-CM | POA: Diagnosis not present

## 2019-10-01 DIAGNOSIS — H9202 Otalgia, left ear: Secondary | ICD-10-CM | POA: Diagnosis not present

## 2019-10-31 DIAGNOSIS — F3342 Major depressive disorder, recurrent, in full remission: Secondary | ICD-10-CM | POA: Diagnosis not present

## 2019-10-31 DIAGNOSIS — F900 Attention-deficit hyperactivity disorder, predominantly inattentive type: Secondary | ICD-10-CM | POA: Diagnosis not present

## 2019-11-01 DIAGNOSIS — M47816 Spondylosis without myelopathy or radiculopathy, lumbar region: Secondary | ICD-10-CM | POA: Diagnosis not present

## 2019-11-01 DIAGNOSIS — G8929 Other chronic pain: Secondary | ICD-10-CM | POA: Diagnosis not present

## 2019-11-01 DIAGNOSIS — E291 Testicular hypofunction: Secondary | ICD-10-CM | POA: Diagnosis not present

## 2019-11-01 DIAGNOSIS — E782 Mixed hyperlipidemia: Secondary | ICD-10-CM | POA: Diagnosis not present

## 2020-02-18 DIAGNOSIS — G8929 Other chronic pain: Secondary | ICD-10-CM | POA: Diagnosis not present

## 2020-02-18 DIAGNOSIS — M47816 Spondylosis without myelopathy or radiculopathy, lumbar region: Secondary | ICD-10-CM | POA: Diagnosis not present

## 2020-02-18 DIAGNOSIS — M545 Low back pain: Secondary | ICD-10-CM | POA: Diagnosis not present

## 2020-04-23 DIAGNOSIS — F900 Attention-deficit hyperactivity disorder, predominantly inattentive type: Secondary | ICD-10-CM | POA: Diagnosis not present

## 2020-04-23 DIAGNOSIS — F3342 Major depressive disorder, recurrent, in full remission: Secondary | ICD-10-CM | POA: Diagnosis not present

## 2020-05-13 DIAGNOSIS — G8929 Other chronic pain: Secondary | ICD-10-CM | POA: Diagnosis not present

## 2020-05-13 DIAGNOSIS — M47816 Spondylosis without myelopathy or radiculopathy, lumbar region: Secondary | ICD-10-CM | POA: Diagnosis not present

## 2020-05-31 DIAGNOSIS — M542 Cervicalgia: Secondary | ICD-10-CM | POA: Diagnosis not present

## 2020-06-02 DIAGNOSIS — M531 Cervicobrachial syndrome: Secondary | ICD-10-CM | POA: Diagnosis not present

## 2020-06-02 DIAGNOSIS — M5136 Other intervertebral disc degeneration, lumbar region: Secondary | ICD-10-CM | POA: Diagnosis not present

## 2020-06-02 DIAGNOSIS — M9903 Segmental and somatic dysfunction of lumbar region: Secondary | ICD-10-CM | POA: Diagnosis not present

## 2020-06-02 DIAGNOSIS — M9904 Segmental and somatic dysfunction of sacral region: Secondary | ICD-10-CM | POA: Diagnosis not present

## 2020-06-05 DIAGNOSIS — M531 Cervicobrachial syndrome: Secondary | ICD-10-CM | POA: Diagnosis not present

## 2020-06-05 DIAGNOSIS — M5136 Other intervertebral disc degeneration, lumbar region: Secondary | ICD-10-CM | POA: Diagnosis not present

## 2020-06-05 DIAGNOSIS — M9903 Segmental and somatic dysfunction of lumbar region: Secondary | ICD-10-CM | POA: Diagnosis not present

## 2020-06-05 DIAGNOSIS — M9904 Segmental and somatic dysfunction of sacral region: Secondary | ICD-10-CM | POA: Diagnosis not present

## 2020-07-06 DIAGNOSIS — M5136 Other intervertebral disc degeneration, lumbar region: Secondary | ICD-10-CM | POA: Diagnosis not present

## 2020-07-06 DIAGNOSIS — M9903 Segmental and somatic dysfunction of lumbar region: Secondary | ICD-10-CM | POA: Diagnosis not present

## 2020-07-06 DIAGNOSIS — M9904 Segmental and somatic dysfunction of sacral region: Secondary | ICD-10-CM | POA: Diagnosis not present

## 2020-07-06 DIAGNOSIS — M531 Cervicobrachial syndrome: Secondary | ICD-10-CM | POA: Diagnosis not present

## 2020-07-08 DIAGNOSIS — M5136 Other intervertebral disc degeneration, lumbar region: Secondary | ICD-10-CM | POA: Diagnosis not present

## 2020-07-08 DIAGNOSIS — M9904 Segmental and somatic dysfunction of sacral region: Secondary | ICD-10-CM | POA: Diagnosis not present

## 2020-07-08 DIAGNOSIS — M9903 Segmental and somatic dysfunction of lumbar region: Secondary | ICD-10-CM | POA: Diagnosis not present

## 2020-07-08 DIAGNOSIS — M531 Cervicobrachial syndrome: Secondary | ICD-10-CM | POA: Diagnosis not present

## 2020-08-11 DIAGNOSIS — J45909 Unspecified asthma, uncomplicated: Secondary | ICD-10-CM | POA: Diagnosis not present

## 2020-08-11 DIAGNOSIS — G8929 Other chronic pain: Secondary | ICD-10-CM | POA: Diagnosis not present

## 2020-08-11 DIAGNOSIS — M47816 Spondylosis without myelopathy or radiculopathy, lumbar region: Secondary | ICD-10-CM | POA: Diagnosis not present

## 2020-10-22 DIAGNOSIS — F410 Panic disorder [episodic paroxysmal anxiety] without agoraphobia: Secondary | ICD-10-CM | POA: Diagnosis not present

## 2020-10-22 DIAGNOSIS — F900 Attention-deficit hyperactivity disorder, predominantly inattentive type: Secondary | ICD-10-CM | POA: Diagnosis not present

## 2020-10-22 DIAGNOSIS — F3342 Major depressive disorder, recurrent, in full remission: Secondary | ICD-10-CM | POA: Diagnosis not present

## 2020-11-10 DIAGNOSIS — I10 Essential (primary) hypertension: Secondary | ICD-10-CM | POA: Diagnosis not present

## 2020-11-10 DIAGNOSIS — G8929 Other chronic pain: Secondary | ICD-10-CM | POA: Diagnosis not present

## 2021-02-08 DIAGNOSIS — J309 Allergic rhinitis, unspecified: Secondary | ICD-10-CM | POA: Diagnosis not present

## 2021-02-08 DIAGNOSIS — E291 Testicular hypofunction: Secondary | ICD-10-CM | POA: Diagnosis not present

## 2021-02-08 DIAGNOSIS — G8929 Other chronic pain: Secondary | ICD-10-CM | POA: Diagnosis not present

## 2021-02-08 DIAGNOSIS — E782 Mixed hyperlipidemia: Secondary | ICD-10-CM | POA: Diagnosis not present

## 2021-02-19 DIAGNOSIS — E291 Testicular hypofunction: Secondary | ICD-10-CM | POA: Diagnosis not present

## 2021-02-19 DIAGNOSIS — E782 Mixed hyperlipidemia: Secondary | ICD-10-CM | POA: Diagnosis not present

## 2021-04-16 DIAGNOSIS — F900 Attention-deficit hyperactivity disorder, predominantly inattentive type: Secondary | ICD-10-CM | POA: Diagnosis not present

## 2021-04-16 DIAGNOSIS — F331 Major depressive disorder, recurrent, moderate: Secondary | ICD-10-CM | POA: Diagnosis not present

## 2021-04-16 DIAGNOSIS — F411 Generalized anxiety disorder: Secondary | ICD-10-CM | POA: Diagnosis not present

## 2021-07-06 DIAGNOSIS — E291 Testicular hypofunction: Secondary | ICD-10-CM | POA: Diagnosis not present

## 2021-08-24 DIAGNOSIS — M9901 Segmental and somatic dysfunction of cervical region: Secondary | ICD-10-CM | POA: Diagnosis not present

## 2021-08-24 DIAGNOSIS — M9902 Segmental and somatic dysfunction of thoracic region: Secondary | ICD-10-CM | POA: Diagnosis not present

## 2021-08-24 DIAGNOSIS — M9903 Segmental and somatic dysfunction of lumbar region: Secondary | ICD-10-CM | POA: Diagnosis not present

## 2021-08-24 DIAGNOSIS — M531 Cervicobrachial syndrome: Secondary | ICD-10-CM | POA: Diagnosis not present

## 2021-08-27 DIAGNOSIS — M9902 Segmental and somatic dysfunction of thoracic region: Secondary | ICD-10-CM | POA: Diagnosis not present

## 2021-08-27 DIAGNOSIS — M9901 Segmental and somatic dysfunction of cervical region: Secondary | ICD-10-CM | POA: Diagnosis not present

## 2021-08-27 DIAGNOSIS — M531 Cervicobrachial syndrome: Secondary | ICD-10-CM | POA: Diagnosis not present

## 2021-08-27 DIAGNOSIS — M9903 Segmental and somatic dysfunction of lumbar region: Secondary | ICD-10-CM | POA: Diagnosis not present

## 2021-08-30 DIAGNOSIS — F331 Major depressive disorder, recurrent, moderate: Secondary | ICD-10-CM | POA: Diagnosis not present

## 2021-08-30 DIAGNOSIS — F900 Attention-deficit hyperactivity disorder, predominantly inattentive type: Secondary | ICD-10-CM | POA: Diagnosis not present

## 2021-08-30 DIAGNOSIS — F411 Generalized anxiety disorder: Secondary | ICD-10-CM | POA: Diagnosis not present

## 2021-08-31 DIAGNOSIS — M9902 Segmental and somatic dysfunction of thoracic region: Secondary | ICD-10-CM | POA: Diagnosis not present

## 2021-08-31 DIAGNOSIS — M531 Cervicobrachial syndrome: Secondary | ICD-10-CM | POA: Diagnosis not present

## 2021-08-31 DIAGNOSIS — M9903 Segmental and somatic dysfunction of lumbar region: Secondary | ICD-10-CM | POA: Diagnosis not present

## 2021-08-31 DIAGNOSIS — M9901 Segmental and somatic dysfunction of cervical region: Secondary | ICD-10-CM | POA: Diagnosis not present

## 2021-09-03 DIAGNOSIS — M531 Cervicobrachial syndrome: Secondary | ICD-10-CM | POA: Diagnosis not present

## 2021-09-03 DIAGNOSIS — M9902 Segmental and somatic dysfunction of thoracic region: Secondary | ICD-10-CM | POA: Diagnosis not present

## 2021-09-03 DIAGNOSIS — M9901 Segmental and somatic dysfunction of cervical region: Secondary | ICD-10-CM | POA: Diagnosis not present

## 2021-09-03 DIAGNOSIS — M9903 Segmental and somatic dysfunction of lumbar region: Secondary | ICD-10-CM | POA: Diagnosis not present

## 2021-09-07 DIAGNOSIS — M9901 Segmental and somatic dysfunction of cervical region: Secondary | ICD-10-CM | POA: Diagnosis not present

## 2021-09-07 DIAGNOSIS — M531 Cervicobrachial syndrome: Secondary | ICD-10-CM | POA: Diagnosis not present

## 2021-09-07 DIAGNOSIS — M9902 Segmental and somatic dysfunction of thoracic region: Secondary | ICD-10-CM | POA: Diagnosis not present

## 2021-09-07 DIAGNOSIS — M9903 Segmental and somatic dysfunction of lumbar region: Secondary | ICD-10-CM | POA: Diagnosis not present

## 2021-09-10 DIAGNOSIS — M9903 Segmental and somatic dysfunction of lumbar region: Secondary | ICD-10-CM | POA: Diagnosis not present

## 2021-09-10 DIAGNOSIS — M9902 Segmental and somatic dysfunction of thoracic region: Secondary | ICD-10-CM | POA: Diagnosis not present

## 2021-09-10 DIAGNOSIS — M9901 Segmental and somatic dysfunction of cervical region: Secondary | ICD-10-CM | POA: Diagnosis not present

## 2021-09-10 DIAGNOSIS — M531 Cervicobrachial syndrome: Secondary | ICD-10-CM | POA: Diagnosis not present

## 2021-09-14 DIAGNOSIS — M9902 Segmental and somatic dysfunction of thoracic region: Secondary | ICD-10-CM | POA: Diagnosis not present

## 2021-09-14 DIAGNOSIS — M531 Cervicobrachial syndrome: Secondary | ICD-10-CM | POA: Diagnosis not present

## 2021-09-14 DIAGNOSIS — M9901 Segmental and somatic dysfunction of cervical region: Secondary | ICD-10-CM | POA: Diagnosis not present

## 2021-09-14 DIAGNOSIS — M9903 Segmental and somatic dysfunction of lumbar region: Secondary | ICD-10-CM | POA: Diagnosis not present

## 2021-09-17 DIAGNOSIS — M531 Cervicobrachial syndrome: Secondary | ICD-10-CM | POA: Diagnosis not present

## 2021-09-17 DIAGNOSIS — M9903 Segmental and somatic dysfunction of lumbar region: Secondary | ICD-10-CM | POA: Diagnosis not present

## 2021-09-17 DIAGNOSIS — M9902 Segmental and somatic dysfunction of thoracic region: Secondary | ICD-10-CM | POA: Diagnosis not present

## 2021-09-17 DIAGNOSIS — M9901 Segmental and somatic dysfunction of cervical region: Secondary | ICD-10-CM | POA: Diagnosis not present

## 2021-09-21 DIAGNOSIS — M9903 Segmental and somatic dysfunction of lumbar region: Secondary | ICD-10-CM | POA: Diagnosis not present

## 2021-09-21 DIAGNOSIS — M9901 Segmental and somatic dysfunction of cervical region: Secondary | ICD-10-CM | POA: Diagnosis not present

## 2021-09-21 DIAGNOSIS — G8929 Other chronic pain: Secondary | ICD-10-CM | POA: Diagnosis not present

## 2021-09-21 DIAGNOSIS — M531 Cervicobrachial syndrome: Secondary | ICD-10-CM | POA: Diagnosis not present

## 2021-09-21 DIAGNOSIS — M9902 Segmental and somatic dysfunction of thoracic region: Secondary | ICD-10-CM | POA: Diagnosis not present

## 2021-10-15 DIAGNOSIS — F900 Attention-deficit hyperactivity disorder, predominantly inattentive type: Secondary | ICD-10-CM | POA: Diagnosis not present

## 2021-10-15 DIAGNOSIS — F3341 Major depressive disorder, recurrent, in partial remission: Secondary | ICD-10-CM | POA: Diagnosis not present

## 2021-10-15 DIAGNOSIS — F4310 Post-traumatic stress disorder, unspecified: Secondary | ICD-10-CM | POA: Diagnosis not present

## 2021-11-26 DIAGNOSIS — F410 Panic disorder [episodic paroxysmal anxiety] without agoraphobia: Secondary | ICD-10-CM | POA: Diagnosis not present

## 2021-11-26 DIAGNOSIS — F3342 Major depressive disorder, recurrent, in full remission: Secondary | ICD-10-CM | POA: Diagnosis not present

## 2021-11-26 DIAGNOSIS — F4310 Post-traumatic stress disorder, unspecified: Secondary | ICD-10-CM | POA: Diagnosis not present

## 2021-11-26 DIAGNOSIS — F900 Attention-deficit hyperactivity disorder, predominantly inattentive type: Secondary | ICD-10-CM | POA: Diagnosis not present

## 2021-12-21 DIAGNOSIS — G8929 Other chronic pain: Secondary | ICD-10-CM | POA: Diagnosis not present

## 2022-01-02 ENCOUNTER — Emergency Department (HOSPITAL_COMMUNITY): Payer: BC Managed Care – PPO

## 2022-01-02 ENCOUNTER — Emergency Department (HOSPITAL_COMMUNITY)
Admission: EM | Admit: 2022-01-02 | Discharge: 2022-01-03 | Disposition: A | Discharge status: Home / Self Care | Payer: BC Managed Care – PPO | Attending: Emergency Medicine | Admitting: Emergency Medicine

## 2022-01-02 DIAGNOSIS — I4510 Unspecified right bundle-branch block: Secondary | ICD-10-CM | POA: Diagnosis not present

## 2022-01-02 DIAGNOSIS — R410 Disorientation, unspecified: Secondary | ICD-10-CM | POA: Diagnosis not present

## 2022-01-02 DIAGNOSIS — G319 Degenerative disease of nervous system, unspecified: Secondary | ICD-10-CM | POA: Diagnosis not present

## 2022-01-02 DIAGNOSIS — F17200 Nicotine dependence, unspecified, uncomplicated: Secondary | ICD-10-CM | POA: Insufficient documentation

## 2022-01-02 DIAGNOSIS — M25512 Pain in left shoulder: Secondary | ICD-10-CM | POA: Diagnosis not present

## 2022-01-02 DIAGNOSIS — R404 Transient alteration of awareness: Secondary | ICD-10-CM

## 2022-01-02 DIAGNOSIS — R4189 Other symptoms and signs involving cognitive functions and awareness: Secondary | ICD-10-CM

## 2022-01-02 DIAGNOSIS — R4020 Unspecified coma: Secondary | ICD-10-CM | POA: Insufficient documentation

## 2022-01-02 DIAGNOSIS — J45909 Unspecified asthma, uncomplicated: Secondary | ICD-10-CM | POA: Insufficient documentation

## 2022-01-02 DIAGNOSIS — M19012 Primary osteoarthritis, left shoulder: Secondary | ICD-10-CM | POA: Diagnosis not present

## 2022-01-02 DIAGNOSIS — I10 Essential (primary) hypertension: Secondary | ICD-10-CM | POA: Diagnosis not present

## 2022-01-02 DIAGNOSIS — R569 Unspecified convulsions: Secondary | ICD-10-CM | POA: Insufficient documentation

## 2022-01-02 LAB — COMPREHENSIVE METABOLIC PANEL
ALT: 20 U/L (ref 0–44)
AST: 23 U/L (ref 15–41)
Albumin: 3.7 g/dL (ref 3.5–5.0)
Alkaline Phosphatase: 83 U/L (ref 38–126)
Anion gap: 10 (ref 5–15)
BUN: 7 mg/dL (ref 6–20)
CO2: 25 mmol/L (ref 22–32)
Calcium: 8 mg/dL — ABNORMAL LOW (ref 8.9–10.3)
Chloride: 101 mmol/L (ref 98–111)
Creatinine, Ser: 0.86 mg/dL (ref 0.61–1.24)
GFR, Estimated: >60 mL/min (ref >60)
Glucose, Bld: 115 mg/dL — ABNORMAL HIGH (ref 70–99)
Potassium: 3.7 mmol/L (ref 3.5–5.1)
Sodium: 136 mmol/L (ref 135–145)
Total Bilirubin: 0.4 mg/dL (ref 0.3–1.2)
Total Protein: 6.3 g/dL — ABNORMAL LOW (ref 6.5–8.1)

## 2022-01-02 LAB — CBC WITH DIFFERENTIAL/PLATELET
Abs Immature Granulocytes: 0.16 10*3/uL — ABNORMAL HIGH (ref 0.00–0.07)
Basophils Absolute: 0.1 10*3/uL (ref 0.0–0.1)
Basophils Relative: 0 %
Eosinophils Absolute: 0 10*3/uL (ref 0.0–0.5)
Eosinophils Relative: 0 %
HCT: 52.1 % — ABNORMAL HIGH (ref 39.0–52.0)
Hemoglobin: 17.5 g/dL — ABNORMAL HIGH (ref 13.0–17.0)
Immature Granulocytes: 1 %
Lymphocytes Relative: 10 %
Lymphs Abs: 1.8 10*3/uL (ref 0.7–4.0)
MCH: 30.2 pg (ref 26.0–34.0)
MCHC: 33.6 g/dL (ref 30.0–36.0)
MCV: 89.8 fL (ref 80.0–100.0)
Monocytes Absolute: 1 10*3/uL (ref 0.1–1.0)
Monocytes Relative: 6 %
Neutro Abs: 15.6 10*3/uL — ABNORMAL HIGH (ref 1.7–7.7)
Neutrophils Relative %: 83 %
Platelets: 241 10*3/uL (ref 150–400)
RBC: 5.8 MIL/uL (ref 4.22–5.81)
RDW: 14.4 % (ref 11.5–15.5)
WBC: 18.7 10*3/uL — ABNORMAL HIGH (ref 4.0–10.5)
nRBC: 0 % (ref 0.0–0.2)

## 2022-01-02 LAB — URINALYSIS, ROUTINE W REFLEX MICROSCOPIC
Bilirubin Urine: NEGATIVE
Glucose, UA: NEGATIVE mg/dL
Hgb urine dipstick: NEGATIVE
Ketones, ur: NEGATIVE mg/dL
Leukocytes,Ua: NEGATIVE
Nitrite: NEGATIVE
Protein, ur: NEGATIVE mg/dL
Specific Gravity, Urine: 1.015 (ref 1.005–1.030)
pH: 6.5 (ref 5.0–8.0)

## 2022-01-02 LAB — RAPID URINE DRUG SCREEN, HOSP PERFORMED
Amphetamines: POSITIVE — AB
Barbiturates: NOT DETECTED
Benzodiazepines: NOT DETECTED
Cocaine: NOT DETECTED
Opiates: NOT DETECTED
Tetrahydrocannabinol: NOT DETECTED

## 2022-01-02 LAB — CBG MONITORING, ED: Glucose-Capillary: 115 mg/dL — ABNORMAL HIGH (ref 70–99)

## 2022-01-02 LAB — LACTIC ACID, PLASMA: Lactic Acid, Venous: 1.3 mmol/L (ref 0.5–1.9)

## 2022-01-02 NOTE — ED Provider Notes (Signed)
[facility] Provider Note  History   Chief Complaint  Patient presents with   Seizures   The history is provided by the patient and the spouse.  Illness Location:  Unresponsive episode Quality:  Not responding to verbal / tactile stimulation Severity:  Unable to specify Onset quality:  Sudden Duration:  2 minutes Timing:  Unable to specify Progression:  Resolved Chronicity:  New Context:  See MDM Associated symptoms: no abdominal pain, no chest pain, no cough, no ear pain, no fever, no headaches, no rash, no shortness of breath, no sore throat and no vomiting     Past Medical History:  Diagnosis Date   Alcohol abuse    Asthma    Hypercholesteremia    Hypertension     Social History   Tobacco Use   Smoking status: Every Day    Packs/day: 0.25    Years: 20.00    Pack years: 5.00    Types: Cigarettes   Smokeless tobacco: Never  Substance Use Topics   Alcohol use: Yes    Alcohol/week: 8.0 standard drinks    Types: 8 Cans of beer per week   Drug use: Yes    Types: Marijuana     Family History  Problem Relation Age of Onset   Alcohol abuse Brother    Alcohol abuse Sister    Alcohol abuse Maternal Grandfather     Review of Systems  Constitutional:  Negative for chills and fever.  HENT:  Negative for ear pain and sore throat.   Eyes:  Negative for pain and visual disturbance.  Respiratory:  Negative for cough and shortness of breath.   Cardiovascular:  Negative for chest pain and palpitations.  Gastrointestinal:  Negative for abdominal pain and vomiting.  Endocrine: Negative.   Genitourinary:  Negative for dysuria and hematuria.  Musculoskeletal:  Negative for arthralgias and back pain.  Skin:  Negative for color change and rash.  Allergic/Immunologic: Negative.   Neurological:  Negative for seizures, syncope and headaches.  Hematological: Negative.   Psychiatric/Behavioral: Negative.    All other systems reviewed  and are negative.   Physical Exam   Today's Vitals   01/02/22 2145 01/02/22 2200 01/02/22 2215 01/02/22 2230  BP:      Pulse: (!) 101 100 91 97  Resp: 20 (!) 22 18 13   Temp:      TempSrc:      SpO2: 96% 96% 96% 94%     Physical Exam Vitals and nursing note reviewed.  Constitutional:      General: He is not in acute distress.    Appearance: He is well-developed. He is obese. He is not ill-appearing or toxic-appearing.  HENT:     Head: Normocephalic and atraumatic.     Nose: Nose normal. No congestion.     Mouth/Throat:     Mouth: Mucous membranes are moist.     Pharynx: Oropharynx is clear. No oropharyngeal exudate.  Eyes:     Extraocular Movements: Extraocular movements intact.     Pupils: Pupils are equal, round, and reactive to light.  Cardiovascular:     Rate and Rhythm: Normal rate and regular rhythm.     Pulses: Normal pulses.     Heart sounds: Normal heart sounds. No murmur heard. Pulmonary:     Effort: Pulmonary effort is normal. No respiratory distress.     Breath sounds: Normal breath sounds. No stridor. No wheezing, rhonchi or rales.  Abdominal:     Palpations: Abdomen is  soft.     Tenderness: There is no abdominal tenderness. There is no right CVA tenderness, left CVA tenderness, guarding or rebound.  Musculoskeletal:        General: No swelling.     Cervical back: Normal range of motion and neck supple. No tenderness.     Right lower leg: No edema.     Left lower leg: No edema.  Skin:    General: Skin is warm and dry.     Capillary Refill: Capillary refill takes less than 2 seconds.     Findings: No rash.  Neurological:     General: No focal deficit present.     Mental Status: He is alert and oriented to person, place, and time. Mental status is at baseline.     Cranial Nerves: No cranial nerve deficit.     Sensory: No sensory deficit.     Motor: No weakness.  Psychiatric:        Mood and Affect: Mood normal.        Behavior: Behavior normal.     ED Course  Procedures  Medical Decision Making:  Marvin Cooper is a 51 y.o. male w/ h/o anxiety, asthma, HTN, HLD, h/o EtOH abuse (EtOH withdrawal seizure x1, 7 years ago, sober since that time) who p/w EMS s/p unresponsive episode.   Awoke in his normal state of health No preceding trauma No preceding infectious symptoms Increased anxiety over the last few days Recreational drug use includes: Kratom for anxiety (last used today at 5 PM), occasional Klonopin for anxiety (not used today), occasional Vicodin used for pain (not used today).  Denies EtOH intake x7 years.  Wife states that the patient was eating dinner downstairs in a recliner while she (the wife) was upstairs with their daughter.  She (the wife) began to hear noises coming from downstairs as if the patient were vomiting.  Wife called out to the patient, and when the patient did not answer, wife proceeded down the stairs to investigate.  Wife found the patient completely flat in the recliner, slight foam coming from his mouth, eyes wide open, no tonic/clonic movement noted.  Patient was breathing on his own, but was not responding to verbal/tactile stimulation.  Wife sat in the recliner up, initially concerned that he was choking.  The patient then began making noises and speaking a bit.  Both the wife and the patient reviewed their in-home cameras which reviewed the patient sitting upright eating at 7:23pm, reclined at 7:24pm (as the wife described), and back upright with the wife at 7:26pm.   The patient is amnestic to the event, and is unsure what happened The patient's only complaint is left shoulder pain which he noticed after this incident tonight.  The patient states he has a history of "bone spurs" in his right shoulder, and his left shoulder now feels similarly.  The patient is neurovascularly intact.  His neuro exam is unremarkable.  No immunosuppression hx, no preceding fever Unlikely stroke or syncope Unlikely  infectious etiology No preceding trauma  ER provider interpretation of Imaging / Radiology:  CT H: No acute intracranial abnormality XR L shoulder: Mild degenerative change without acute abnormality  ER provider interpretation of EKG:  EKG: Sinus rhythm, PR 208, QTc 505, RBBB.  No ST elevation or ST depression.  ER provider interpretation of Labs:  FSBS 115 CBC WBC 18.7, no acute anemia At the time of handoff, the following labs are pending: CMP, TSH, ammonia, lactic acid, ethanol/Tylenol/salicylate level, troponin,  VBG, UDS, UA  Key medications administered in the ER:  Medications - No data to display  Diagnoses considered: Etiology likely kratom ingestion (causing tachy and unresponsive episode). Ddx considered for this patient includes neurologic causes (primary seizures, status epilepticus, febrile seizure, stroke, AVM, migraine, degenerative CNS diseases), Head injury (IPH, SAH, SDH, epidural), Infection (Meningitis, encephalitis, brain abscess, HIV encephalopathy, neurosyphilis, CJD, toxo, tetanus, neurocysticercosis), Toxic (withdrawal, intoxication, hypo/hyperglycemia, hypo/hypernatremia, hypocalcemia, hypomagnesemia, alkalosis, uremia, hepatic failure, thyrotoxicosis), Neoplasm (brain tumor, CNS lymphoma)  Consulted: None thus far  Dispo: Pending work-up.  At the time of care transition, majority of labs were in process.  If labs are unremarkable, and patient remained stable then anticipate discharge.  Suspect kratom ingestion may have caused both tachycardia and unresponsive episode.  Counseled patient regarding recreational drug use.  Patient seen in conjunction with Dr. Ashok Cordia Care transition to [place], [place] at [place] dictation software was used in the creation of this note.   Electronically signed by: Wynetta Fines, MD on 01/02/2022 at 11:41 PM  Clinical Impression:  1. Unresponsive episode     Dispo: Data Rogelio Seen, MD 01/04/22  1327    Lajean Saver, MD 01/06/22 1356

## 2022-01-02 NOTE — ED Triage Notes (Signed)
Pt arrives to ED BIB [facility] due to possible seizures. Per EMS pt's wife found pt "on recliner unresponsive with eyes open and foaming at the mouth but not shaking". Per EMS pt does have Hx of Seizures, last one x5 years ago caused from alcohol withdrawal but pts states he has not drank since. Per EMS pt tachy on their arrival at HR120 BP 150/102 and was given of fluids and HR came down to 90. Pt A/O x4.

## 2022-01-03 ENCOUNTER — Encounter (HOSPITAL_COMMUNITY): Payer: Self-pay

## 2022-01-03 ENCOUNTER — Other Ambulatory Visit: Payer: Self-pay

## 2022-01-03 LAB — I-STAT VENOUS BLOOD GAS, ED
Acid-Base Excess: 1 mmol/L (ref 0.0–2.0)
Bicarbonate: 27 mmol/L (ref 20.0–28.0)
Calcium, Ion: 0.99 mmol/L — ABNORMAL LOW (ref 1.15–1.40)
HCT: 54 % — ABNORMAL HIGH (ref 39.0–52.0)
Hemoglobin: 18.4 g/dL — ABNORMAL HIGH (ref 13.0–17.0)
O2 Saturation: 99 %
Potassium: 3.8 mmol/L (ref 3.5–5.1)
Sodium: 139 mmol/L (ref 135–145)
TCO2: 28 mmol/L (ref 22–32)
pCO2, Ven: 44.7 mmHg (ref 44–60)
pH, Ven: 7.389 (ref 7.25–7.43)
pO2, Ven: 127 mmHg — ABNORMAL HIGH (ref 32–45)

## 2022-01-03 LAB — AMMONIA: Ammonia: 60 umol/L — ABNORMAL HIGH (ref 9–35)

## 2022-01-03 LAB — SALICYLATE LEVEL: Salicylate Lvl: <7 mg/dL — ABNORMAL LOW (ref 7.0–30.0)

## 2022-01-03 LAB — ACETAMINOPHEN LEVEL: Acetaminophen (Tylenol), Serum: <10 ug/mL — ABNORMAL LOW (ref 10–30)

## 2022-01-03 LAB — LACTIC ACID, PLASMA: Lactic Acid, Venous: 1 mmol/L (ref 0.5–1.9)

## 2022-01-03 LAB — TSH: TSH: 2.558 u[IU]/mL (ref 0.350–4.500)

## 2022-01-03 LAB — TROPONIN I (HIGH SENSITIVITY): Troponin I (High Sensitivity): 8 ng/L (ref <18)

## 2022-01-03 LAB — ETHANOL: Alcohol, Ethyl (B): <10 mg/dL (ref <10)

## 2022-01-03 NOTE — ED Provider Notes (Signed)
Patient signed out to me at shift change.  He was brought in after seizure-like event after kratom use.  He is back to his normal baseline.  He has had fairly reassuring work-up in the ED.  Complains of left shoulder pain after lifting a propane tank.  We will put patient in a sling.  Recommend PCP and orthopedic follow-up.  He is stable for discharge.  He is agreeable with this plan.   Montine Circle, PA-C 01/03/22 0139    Fatima Blank, MD 01/03/22 [phone]

## 2022-01-03 NOTE — Discharge Instructions (Signed)
Follow-up with your doctor.  Substances such as kratom are not well regulated.  Use caution.  Follow-up with your orthopedic doctor for your shoulder pain.

## 2022-01-06 DIAGNOSIS — F19188 Other psychoactive substance abuse with other psychoactive substance-induced disorder: Secondary | ICD-10-CM | POA: Diagnosis not present

## 2022-01-06 DIAGNOSIS — M25512 Pain in left shoulder: Secondary | ICD-10-CM | POA: Diagnosis not present

## 2022-01-06 DIAGNOSIS — Z79891 Long term (current) use of opiate analgesic: Secondary | ICD-10-CM | POA: Diagnosis not present

## 2022-01-06 DIAGNOSIS — R569 Unspecified convulsions: Secondary | ICD-10-CM | POA: Diagnosis not present

## 2022-01-12 ENCOUNTER — Ambulatory Visit: Payer: BC Managed Care – PPO | Admitting: Neurology

## 2022-01-12 ENCOUNTER — Encounter: Payer: Self-pay | Admitting: Neurology

## 2022-01-12 VITALS — BP 169/99 | HR 91 | Ht 67.0 in | Wt 197.0 lb

## 2022-01-12 DIAGNOSIS — G40909 Epilepsy, unspecified, not intractable, without status epilepticus: Secondary | ICD-10-CM | POA: Diagnosis not present

## 2022-01-12 DIAGNOSIS — M25512 Pain in left shoulder: Secondary | ICD-10-CM | POA: Diagnosis not present

## 2022-01-12 DIAGNOSIS — F411 Generalized anxiety disorder: Secondary | ICD-10-CM

## 2022-01-12 DIAGNOSIS — F988 Other specified behavioral and emotional disorders with onset usually occurring in childhood and adolescence: Secondary | ICD-10-CM

## 2022-01-12 NOTE — Progress Notes (Signed)
[facility]  PATIENT: Marvin Cooper DOB: 1971-06-26  REQUESTING CLINICIAN: Mitzi Hansen, NP HISTORY FROM: Patient and spouse  REASON FOR VISIT: Seizure    HISTORICAL  CHIEF COMPLAINT:  Chief Complaint  Patient presents with   New Patient (Initial Visit)    Room 13 w/ wife, Herbert Seta. ED follow up for seizure-like activity. No medication started.     HISTORY OF PRESENT ILLNESS:  This is a 51 year old gentleman past medical history of generalized anxiety disorder, ADD and hyperlipidemia who is presenting after a seizure on February 19.  Patient reports sitting at home eating dinner, watching TV and next thing that he remembers is wife standing over her with EMS around.  He was confused for a minute.  He reported he was back to his normal self a couple hours later in the ED. Wife reported she was upstairs, she hear a weird noise, called patient but he did not respond.  She went downstairs and found him in a very tense position with eyes open and unresponsive.  She thought initially that he was choking.  She tried the Heimlich maneuver but noted that he was breathing then she called EMS.  Episode lasted a few minutes.  In the ED labs were within normal limits, patient does take benzodiazepine, and also during this time he reported taking more than normal when his anxiety is worse.  He was also taking kratom supplement for his anxiety, states that some days he will take more than other days. Patient reported 5 years ago, he did have a seizure in the setting of alcohol withdrawal.  He was doing alcohol detox, went to rehab, he was started on medication to prevent alcohol withdrawal seizure but he had one.  That was his first seizure and none since.     Handedness: Right handed   Onset: Feb 19, 5 years ago had alcohol withdrawal seizures  Seizure Type: Generalized   Current frequency: 2/19  Any injuries from seizures: Tongue biting, left shoulder injury  Seizure risk  factors: None reported   Previous ASMs: None   Currenty ASMs: None   ASMs side effects: N/A   Brain Images: Normal head CT  Previous EEGs: None   OTHER MEDICAL CONDITIONS: Hyperlipidemia, ADD, Anxiety   REVIEW OF SYSTEMS: Full 14 system review of systems performed and negative with exception of: as noted in the HPI   ALLERGIES: No Known Allergies  HOME MEDICATIONS: Outpatient Medications Prior to Visit  Medication Sig Dispense Refill   albuterol (PROVENTIL HFA;VENTOLIN HFA) 108 (90 BASE) MCG/ACT inhaler Inhale 2 puffs into the lungs every 4 (four) hours as needed for wheezing or shortness of breath.     amphetamine-dextroamphetamine (ADDERALL XR) 20 MG 24 hr capsule Take 20 mg by mouth 3 (three) times daily.     benazepril (LOTENSIN) 40 MG tablet Take 40 mg by mouth daily.     clonazePAM (KLONOPIN) 0.5 MG tablet Take 0.5 mg by mouth 4 (four) times daily as needed for anxiety.     desvenlafaxine (PRISTIQ) 100 MG 24 hr tablet Take 100 mg by mouth every morning.     fluticasone (FLONASE) 50 MCG/ACT nasal spray Place 1 spray into both nostrils daily as needed for allergies.     OVER THE COUNTER MEDICATION Take 1 Scoop by mouth 3 (three) times daily. kratom     oxyCODONE-acetaminophen (PERCOCET) 10-325 MG tablet Take 1 tablet by mouth every 8 (eight) hours.     simvastatin (ZOCOR) 40 MG tablet Take  40 mg by mouth at bedtime.     testosterone cypionate (DEPOTESTOSTERONE CYPIONATE) 200 MG/ML injection Inject into the muscle once a week. 0.6 ml     LORazepam (ATIVAN) 1 MG tablet Take 1 tablet (1 mg total) by mouth every 4 (four) hours as needed for anxiety. (Patient not taking: Reported on 01/02/2022) 10 tablet 0   naloxone (NARCAN) nasal spray 4 mg/0.1 mL Place 0.4 mg into the nose as needed (overdose).     No facility-administered medications prior to visit.    PAST MEDICAL HISTORY: Past Medical History:  Diagnosis Date   ADD (attention deficit disorder)    Alcohol abuse     Anxiety    Asthma    Chronic back pain    Depression    Hypercholesteremia    Hypertension    Low testosterone    Seasonal allergies     PAST SURGICAL HISTORY: Past Surgical History:  Procedure Laterality Date   ADENOIDECTOMY  Age 75   KNEE SURGERY     LEG SURGERY Left Age 32   ORIF lower leg   SHOULDER ARTHROSCOPY      FAMILY HISTORY: Family History  Problem Relation Age of Onset   Hypertension Mother    Other Father        died in accident   Hypertension Sister    Alcohol abuse Sister    Alcohol abuse Brother    Heart attack Brother    Alcohol abuse Maternal Grandfather     SOCIAL HISTORY: Social History   Socioeconomic History   Marital status: Married    Spouse name: Not on file   Number of children: 2   Years of education: some college   Highest education level: Not on file  Occupational History    Employer: [employer]   Occupation: office work  Tobacco Use   Smoking status: Every Day    Packs/day: 0.50    Years: 20.00    Pack years: 10.00    Types: Cigarettes   Smokeless tobacco: Never  Substance and Sexual Activity   Alcohol use: Not on file    Comment: 01/12/22 - reports 6-7 years sober   Drug use: Yes    Types: Marijuana    Comment: occasional use   Sexual activity: Yes    Partners: Female  Other Topics Concern   Not on file  Social History Narrative   Grew up in [place]. Moved to [place] in 1997. High school graduate.  Trade school education in welding. Married twice, divorced once. Son by first marriage, daughter by second marriage. He enjoys riding Interior and spatial designer.   Right-handed.   Six cups caffeine per day.   Social Determinants of Health   Financial Resource Strain: Not on file  Food Insecurity: Not on file  Transportation Needs: Not on file  Physical Activity: Not on file  Stress: Not on file  Social Connections: Not on file  Intimate Partner Violence: Not on file    PHYSICAL EXAM  GENERAL  EXAM/CONSTITUTIONAL: Vitals:  Vitals:   01/12/22 0921  BP: (!) 169/99  Pulse: 91  Weight: 197 lb (89.4 kg)  Height: 5\' 7"  (1.702 m)   Body mass index is 30.85 kg/m. Wt Readings from Last 3 Encounters:  01/12/22 197 lb (89.4 kg)  07/09/16 210 lb (95.3 kg)  10/18/15 200 lb (90.7 kg)   Patient is in no distress; well developed, nourished and groomed; neck is supple  CARDIOVASCULAR: Examination of carotid arteries is normal;  no carotid bruits Regular rate and rhythm, no murmurs Examination of peripheral vascular system by observation and palpation is normal  EYES: Pupils round and reactive to light, Visual fields full to confrontation, Extraocular movements intacts,  No results found.  MUSCULOSKELETAL: Gait, strength, tone, movements noted in Neurologic exam below  NEUROLOGIC: MENTAL STATUS:  No flowsheet data found. awake, alert, oriented to person, place and time recent and remote memory intact normal attention and concentration language fluent, comprehension intact, naming intact fund of knowledge appropriate  CRANIAL NERVE: 2nd, 3rd, 4th, 6th - pupils equal and reactive to light, visual fields full to confrontation, extraocular muscles intact, no nystagmus 5th - facial sensation symmetric 7th - facial strength symmetric 8th - hearing intact 9th - palate elevates symmetrically, uvula midline 11th - shoulder shrug symmetric 12th - tongue protrusion midline  MOTOR:  normal bulk and tone, full strength in the BUE, BLE  SENSORY:  normal and symmetric to light touch, pinprick, temperature, vibration  COORDINATION:  finger-nose-finger, fine finger movements normal  REFLEXES:  deep tendon reflexes present and symmetric  GAIT/STATION:  normal   DIAGNOSTIC DATA (LABS, IMAGING, TESTING) - I reviewed patient records, labs, notes, testing and imaging myself where available.  Lab Results  Component Value Date   WBC 18.7 (H) 01/02/2022   HGB 18.4 (H) 01/03/2022    HCT 54.0 (H) 01/03/2022   MCV 89.8 01/02/2022   PLT 241 01/02/2022      Component Value Date/Time   NA 139 01/03/2022 0011   K 3.8 01/03/2022 0011   CL 101 01/02/2022 2138   CO2 25 01/02/2022 2138   GLUCOSE 115 (H) 01/02/2022 2138   BUN 7 01/02/2022 2138   CREATININE 0.86 01/02/2022 2138   CALCIUM 8.0 (L) 01/02/2022 2138   PROT 6.3 (L) 01/02/2022 2138   ALBUMIN 3.7 01/02/2022 2138   AST 23 01/02/2022 2138   ALT 20 01/02/2022 2138   ALKPHOS 83 01/02/2022 2138   BILITOT 0.4 01/02/2022 2138   GFRNONAA >60 01/02/2022 2138   GFRAA >60 07/11/2016 0120   No results found for: CHOL, HDL, LDLCALC, LDLDIRECT, TRIG No results found for: HGBA1C No results found for: VITAMINB12 Lab Results  Component Value Date   TSH 2.558 01/02/2022    Head CT 2/19 No acute intracranial abnormality.   ASSESSMENT AND PLAN  51 y.o. year old male  with past medical history of anxiety disorder, ADD and hyperlipidemia who is presenting after a seizure on February 19.  Patient reports on that day he was not feeling well, feeling anxious the whole day and in the evening had generalized seizure.  There is also this time he was taking benzodiazepine and kratom supplement, he reported some days he will take more than his usual dose.  I informed the patient that his seizure may be related to benzodiazepine, kratom supplement withdrawal versus kratom overdose.  He did have his first seizure in the setting of alcohol withdrawal, advised him that I will also obtain a EEG to make sure that his brain is not hyperexcitable.  I will contact him to go over the result.  Discussed driving restriction for the next 6 months.  Continue to follow with primary care and psychiatry for better management of his anxiety.  I will see him in 6 months for follow-up.   1. Seizure disorder (HCC)   2. GAD (generalized anxiety disorder)   3. Attention deficit disorder, unspecified hyperactivity presence     Patient Instructions   Routine EEG  Advised  him to follow-up with psychiatry for better management of his anxiety No driving for the next 6 months.  I will contact you after completion of the EEG, if abnormal, will proceed with brain MRI Routine 30-month   Per [facility] statutes, patients with seizures are not allowed to drive until they have been seizure-free for six months.  Other recommendations include using caution when using heavy equipment or power tools. Avoid working on ladders or at heights. Take showers instead of baths.  Do not swim alone.  Ensure the water temperature is not too high on the home water heater. Do not go swimming alone. Do not lock yourself in a room alone (i.e. bathroom). When caring for infants or small children, sit down when holding, feeding, or changing withdrawal versus to minimize risk of injury to the child in the event you have a seizure. Maintain good sleep hygiene. Avoid alcohol.  Also recommend adequate sleep, hydration, good diet and minimize stress.   During the Seizure  - First, ensure adequate ventilation and place patients on the floor on their left side  Loosen clothing around the neck and ensure the airway is patent. If the patient is clenching the teeth, do not force the mouth open with any object as this can cause severe damage - Remove all items from the surrounding that can be hazardous. The patient may be oblivious to what's happening and may not even know what he or she is doing. If the patient is confused and wandering, either gently guide him/her away and block access to outside areas - Reassure the individual and be comforting - Call 911. In most cases, the seizure ends before EMS arrives. However, there are cases when seizures may last over 3 to 5 minutes. Or the individual may have developed breathing difficulties or severe injuries. If a pregnant patient or a person with diabetes develops a seizure, it is prudent to call an ambulance. - Finally, if the  patient does not regain full consciousness, then call EMS. Most patients will remain confused for about 45 to 90 minutes after a seizure, so you must use judgment in calling for help. - Avoid restraints but make sure the patient is in a bed with padded side rails - Place the individual in a lateral position with the neck slightly flexed; this will help the saliva drain from the mouth and prevent the tongue from falling backward - Remove all nearby furniture and other hazards from the area - Provide verbal assurance as the individual is regaining consciousness - Provide the patient with privacy if possible - Call for help and start treatment as ordered by the caregiver   After the Seizure (Postictal Stage)  After a seizure, most patients experience confusion, fatigue, muscle pain and/or a headache. Thus, one should permit the individual to sleep. For the next few days, reassurance is essential. Being calm and helping reorient the person is also of importance.  Most seizures are painless and end spontaneously. Seizures are not harmful to others but can lead to complications such as stress on the lungs, brain and the heart. Individuals with prior lung problems may develop labored breathing and respiratory distress.     Orders Placed This Encounter  Procedures   EEG adult    No orders of the defined types were placed in this encounter.   Return in about 6 months (around 07/15/2022).  I have spent a total of 46 minutes dedicated to this patient today, preparing to see patient, performing  a medically appropriate examination and evaluation, ordering tests and/or medications and procedures, and counseling and educating the patient/family/caregiver; independently interpreting result and communicating results to the family/patient/caregiver; and documenting clinical information in the electronic medical record.   Windell NorfolkAmadou Jalene Demo, MD 01/12/2022, 11:41 AM  [facility] [address],  [address] [place], [place] [address] [phone]

## 2022-01-12 NOTE — Patient Instructions (Addendum)
Routine EEG  ?Advised him to follow-up with psychiatry for better management of his anxiety ?No driving for the next 6 months.  I will contact you after completion of the EEG, if abnormal, will proceed with brain MRI ?Routine 64-month ?

## 2022-01-18 DIAGNOSIS — M25512 Pain in left shoulder: Secondary | ICD-10-CM | POA: Diagnosis not present

## 2022-01-19 ENCOUNTER — Other Ambulatory Visit: Payer: Self-pay

## 2022-01-19 ENCOUNTER — Ambulatory Visit (INDEPENDENT_AMBULATORY_CARE_PROVIDER_SITE_OTHER): Payer: BC Managed Care – PPO | Admitting: Neurology

## 2022-01-19 DIAGNOSIS — G40909 Epilepsy, unspecified, not intractable, without status epilepticus: Secondary | ICD-10-CM

## 2022-01-21 NOTE — Procedures (Signed)
? ? ?  History: ? ?19 yom with a provoked seizure ? ?EEG classification: Awake and drowsy ? ?Description of the recording: The background rhythms of this recording consists of a fairly well modulated medium amplitude alpha rhythm of 8.5 Hz that is reactive to eye opening and closure. As the record progresses, the patient appears to remain in the waking state throughout the recording. Photic stimulation was performed, did not show any abnormalities. Hyperventilation was also performed, did not show any abnormalities. Toward the end of the recording, the patient enters the drowsy state with slight symmetric slowing seen. The patient never enters stage II sleep. No abnormal epileptiform discharges seen during this recording. There was no focal slowing. EKG monitor shows no evidence of cardiac rhythm abnormalities with a heart rate of 72. ? ?Impression: This is a normal EEG recording in the waking and drowsy state. No evidence of interictal epileptiform discharges seen. A normal EEG does not exclude a diagnosis of epilepsy.  ? ? ?Alric Ran, MD ?[facility] ?  ?

## 2022-01-24 DIAGNOSIS — M25512 Pain in left shoulder: Secondary | ICD-10-CM | POA: Diagnosis not present

## 2022-01-26 DIAGNOSIS — M25512 Pain in left shoulder: Secondary | ICD-10-CM | POA: Diagnosis not present

## 2022-01-26 DIAGNOSIS — M75102 Unspecified rotator cuff tear or rupture of left shoulder, not specified as traumatic: Secondary | ICD-10-CM | POA: Diagnosis not present

## 2022-02-01 DIAGNOSIS — M24112 Other articular cartilage disorders, left shoulder: Secondary | ICD-10-CM | POA: Diagnosis not present

## 2022-02-01 DIAGNOSIS — G8918 Other acute postprocedural pain: Secondary | ICD-10-CM | POA: Diagnosis not present

## 2022-02-01 DIAGNOSIS — M67814 Other specified disorders of tendon, left shoulder: Secondary | ICD-10-CM | POA: Diagnosis not present

## 2022-02-01 DIAGNOSIS — S43432A Superior glenoid labrum lesion of left shoulder, initial encounter: Secondary | ICD-10-CM | POA: Diagnosis not present

## 2022-02-01 DIAGNOSIS — M7522 Bicipital tendinitis, left shoulder: Secondary | ICD-10-CM | POA: Diagnosis not present

## 2022-02-01 DIAGNOSIS — X58XXXA Exposure to other specified factors, initial encounter: Secondary | ICD-10-CM | POA: Diagnosis not present

## 2022-02-01 DIAGNOSIS — M75122 Complete rotator cuff tear or rupture of left shoulder, not specified as traumatic: Secondary | ICD-10-CM | POA: Diagnosis not present

## 2022-02-01 DIAGNOSIS — S46012A Strain of muscle(s) and tendon(s) of the rotator cuff of left shoulder, initial encounter: Secondary | ICD-10-CM | POA: Diagnosis not present

## 2022-02-01 DIAGNOSIS — M19012 Primary osteoarthritis, left shoulder: Secondary | ICD-10-CM | POA: Diagnosis not present

## 2022-02-01 DIAGNOSIS — Y999 Unspecified external cause status: Secondary | ICD-10-CM | POA: Diagnosis not present

## 2022-02-01 DIAGNOSIS — M7542 Impingement syndrome of left shoulder: Secondary | ICD-10-CM | POA: Diagnosis not present

## 2022-02-01 DIAGNOSIS — M659 Synovitis and tenosynovitis, unspecified: Secondary | ICD-10-CM | POA: Diagnosis not present

## 2022-02-02 DIAGNOSIS — Z4889 Encounter for other specified surgical aftercare: Secondary | ICD-10-CM | POA: Diagnosis not present

## 2022-02-02 DIAGNOSIS — I9789 Other postprocedural complications and disorders of the circulatory system, not elsewhere classified: Secondary | ICD-10-CM | POA: Diagnosis not present

## 2022-02-02 DIAGNOSIS — M75102 Unspecified rotator cuff tear or rupture of left shoulder, not specified as traumatic: Secondary | ICD-10-CM | POA: Diagnosis not present

## 2022-02-02 DIAGNOSIS — M75122 Complete rotator cuff tear or rupture of left shoulder, not specified as traumatic: Secondary | ICD-10-CM | POA: Diagnosis not present

## 2022-02-02 DIAGNOSIS — M25512 Pain in left shoulder: Secondary | ICD-10-CM | POA: Diagnosis not present

## 2022-02-08 DIAGNOSIS — M75122 Complete rotator cuff tear or rupture of left shoulder, not specified as traumatic: Secondary | ICD-10-CM | POA: Diagnosis not present

## 2022-02-14 DIAGNOSIS — M75122 Complete rotator cuff tear or rupture of left shoulder, not specified as traumatic: Secondary | ICD-10-CM | POA: Diagnosis not present

## 2022-02-18 DIAGNOSIS — M75122 Complete rotator cuff tear or rupture of left shoulder, not specified as traumatic: Secondary | ICD-10-CM | POA: Diagnosis not present

## 2022-02-28 DIAGNOSIS — M75122 Complete rotator cuff tear or rupture of left shoulder, not specified as traumatic: Secondary | ICD-10-CM | POA: Diagnosis not present

## 2022-03-04 DIAGNOSIS — M531 Cervicobrachial syndrome: Secondary | ICD-10-CM | POA: Diagnosis not present

## 2022-03-04 DIAGNOSIS — M9902 Segmental and somatic dysfunction of thoracic region: Secondary | ICD-10-CM | POA: Diagnosis not present

## 2022-03-04 DIAGNOSIS — M9901 Segmental and somatic dysfunction of cervical region: Secondary | ICD-10-CM | POA: Diagnosis not present

## 2022-03-07 DIAGNOSIS — M75122 Complete rotator cuff tear or rupture of left shoulder, not specified as traumatic: Secondary | ICD-10-CM | POA: Diagnosis not present

## 2022-03-14 DIAGNOSIS — M75122 Complete rotator cuff tear or rupture of left shoulder, not specified as traumatic: Secondary | ICD-10-CM | POA: Diagnosis not present

## 2022-03-18 DIAGNOSIS — M531 Cervicobrachial syndrome: Secondary | ICD-10-CM | POA: Diagnosis not present

## 2022-03-18 DIAGNOSIS — M9902 Segmental and somatic dysfunction of thoracic region: Secondary | ICD-10-CM | POA: Diagnosis not present

## 2022-03-18 DIAGNOSIS — M75122 Complete rotator cuff tear or rupture of left shoulder, not specified as traumatic: Secondary | ICD-10-CM | POA: Diagnosis not present

## 2022-03-18 DIAGNOSIS — M9901 Segmental and somatic dysfunction of cervical region: Secondary | ICD-10-CM | POA: Diagnosis not present

## 2022-03-21 DIAGNOSIS — M75122 Complete rotator cuff tear or rupture of left shoulder, not specified as traumatic: Secondary | ICD-10-CM | POA: Diagnosis not present

## 2022-03-30 DIAGNOSIS — F1210 Cannabis abuse, uncomplicated: Secondary | ICD-10-CM | POA: Diagnosis not present

## 2022-03-30 DIAGNOSIS — F17200 Nicotine dependence, unspecified, uncomplicated: Secondary | ICD-10-CM | POA: Diagnosis not present

## 2022-03-30 DIAGNOSIS — R809 Proteinuria, unspecified: Secondary | ICD-10-CM | POA: Diagnosis not present

## 2022-03-30 DIAGNOSIS — F410 Panic disorder [episodic paroxysmal anxiety] without agoraphobia: Secondary | ICD-10-CM | POA: Diagnosis not present

## 2022-03-30 DIAGNOSIS — F1120 Opioid dependence, uncomplicated: Secondary | ICD-10-CM | POA: Diagnosis not present

## 2022-03-30 DIAGNOSIS — R062 Wheezing: Secondary | ICD-10-CM | POA: Diagnosis not present

## 2022-03-30 DIAGNOSIS — F329 Major depressive disorder, single episode, unspecified: Secondary | ICD-10-CM | POA: Diagnosis not present

## 2022-03-30 DIAGNOSIS — F419 Anxiety disorder, unspecified: Secondary | ICD-10-CM | POA: Diagnosis not present

## 2022-03-30 DIAGNOSIS — F1520 Other stimulant dependence, uncomplicated: Secondary | ICD-10-CM | POA: Diagnosis not present

## 2022-03-30 DIAGNOSIS — F1020 Alcohol dependence, uncomplicated: Secondary | ICD-10-CM | POA: Diagnosis not present

## 2022-03-30 DIAGNOSIS — E785 Hyperlipidemia, unspecified: Secondary | ICD-10-CM | POA: Diagnosis not present

## 2022-03-30 DIAGNOSIS — J302 Other seasonal allergic rhinitis: Secondary | ICD-10-CM | POA: Diagnosis not present

## 2022-03-30 DIAGNOSIS — G4089 Other seizures: Secondary | ICD-10-CM | POA: Diagnosis not present

## 2022-03-30 DIAGNOSIS — I10 Essential (primary) hypertension: Secondary | ICD-10-CM | POA: Diagnosis not present

## 2022-03-30 DIAGNOSIS — E291 Testicular hypofunction: Secondary | ICD-10-CM | POA: Diagnosis not present

## 2022-03-30 DIAGNOSIS — F1320 Sedative, hypnotic or anxiolytic dependence, uncomplicated: Secondary | ICD-10-CM | POA: Diagnosis not present

## 2022-04-02 DIAGNOSIS — F17200 Nicotine dependence, unspecified, uncomplicated: Secondary | ICD-10-CM | POA: Diagnosis not present

## 2022-04-02 DIAGNOSIS — E291 Testicular hypofunction: Secondary | ICD-10-CM | POA: Diagnosis not present

## 2022-04-02 DIAGNOSIS — F1120 Opioid dependence, uncomplicated: Secondary | ICD-10-CM | POA: Diagnosis not present

## 2022-04-02 DIAGNOSIS — I10 Essential (primary) hypertension: Secondary | ICD-10-CM | POA: Diagnosis not present

## 2022-04-02 DIAGNOSIS — G4089 Other seizures: Secondary | ICD-10-CM | POA: Diagnosis not present

## 2022-04-02 DIAGNOSIS — F1210 Cannabis abuse, uncomplicated: Secondary | ICD-10-CM | POA: Diagnosis not present

## 2022-04-02 DIAGNOSIS — F410 Panic disorder [episodic paroxysmal anxiety] without agoraphobia: Secondary | ICD-10-CM | POA: Diagnosis not present

## 2022-04-02 DIAGNOSIS — F909 Attention-deficit hyperactivity disorder, unspecified type: Secondary | ICD-10-CM | POA: Diagnosis not present

## 2022-04-02 DIAGNOSIS — F419 Anxiety disorder, unspecified: Secondary | ICD-10-CM | POA: Diagnosis not present

## 2022-04-02 DIAGNOSIS — R062 Wheezing: Secondary | ICD-10-CM | POA: Diagnosis not present

## 2022-04-02 DIAGNOSIS — F1020 Alcohol dependence, uncomplicated: Secondary | ICD-10-CM | POA: Diagnosis not present

## 2022-04-02 DIAGNOSIS — J302 Other seasonal allergic rhinitis: Secondary | ICD-10-CM | POA: Diagnosis not present

## 2022-04-02 DIAGNOSIS — F329 Major depressive disorder, single episode, unspecified: Secondary | ICD-10-CM | POA: Diagnosis not present

## 2022-04-02 DIAGNOSIS — F1520 Other stimulant dependence, uncomplicated: Secondary | ICD-10-CM | POA: Diagnosis not present

## 2022-04-02 DIAGNOSIS — R809 Proteinuria, unspecified: Secondary | ICD-10-CM | POA: Diagnosis not present

## 2022-04-02 DIAGNOSIS — E785 Hyperlipidemia, unspecified: Secondary | ICD-10-CM | POA: Diagnosis not present

## 2022-04-02 DIAGNOSIS — F1320 Sedative, hypnotic or anxiolytic dependence, uncomplicated: Secondary | ICD-10-CM | POA: Diagnosis not present

## 2022-04-15 DIAGNOSIS — R809 Proteinuria, unspecified: Secondary | ICD-10-CM | POA: Diagnosis not present

## 2022-04-15 DIAGNOSIS — F17200 Nicotine dependence, unspecified, uncomplicated: Secondary | ICD-10-CM | POA: Diagnosis not present

## 2022-04-15 DIAGNOSIS — F1120 Opioid dependence, uncomplicated: Secondary | ICD-10-CM | POA: Diagnosis not present

## 2022-04-15 DIAGNOSIS — J302 Other seasonal allergic rhinitis: Secondary | ICD-10-CM | POA: Diagnosis not present

## 2022-04-15 DIAGNOSIS — F1020 Alcohol dependence, uncomplicated: Secondary | ICD-10-CM | POA: Diagnosis not present

## 2022-04-15 DIAGNOSIS — E291 Testicular hypofunction: Secondary | ICD-10-CM | POA: Diagnosis not present

## 2022-04-15 DIAGNOSIS — F1520 Other stimulant dependence, uncomplicated: Secondary | ICD-10-CM | POA: Diagnosis not present

## 2022-04-15 DIAGNOSIS — I10 Essential (primary) hypertension: Secondary | ICD-10-CM | POA: Diagnosis not present

## 2022-04-15 DIAGNOSIS — F410 Panic disorder [episodic paroxysmal anxiety] without agoraphobia: Secondary | ICD-10-CM | POA: Diagnosis not present

## 2022-04-15 DIAGNOSIS — F329 Major depressive disorder, single episode, unspecified: Secondary | ICD-10-CM | POA: Diagnosis not present

## 2022-04-15 DIAGNOSIS — F419 Anxiety disorder, unspecified: Secondary | ICD-10-CM | POA: Diagnosis not present

## 2022-04-15 DIAGNOSIS — G4089 Other seizures: Secondary | ICD-10-CM | POA: Diagnosis not present

## 2022-04-15 DIAGNOSIS — E785 Hyperlipidemia, unspecified: Secondary | ICD-10-CM | POA: Diagnosis not present

## 2022-04-15 DIAGNOSIS — F1320 Sedative, hypnotic or anxiolytic dependence, uncomplicated: Secondary | ICD-10-CM | POA: Diagnosis not present

## 2022-04-15 DIAGNOSIS — F1210 Cannabis abuse, uncomplicated: Secondary | ICD-10-CM | POA: Diagnosis not present

## 2022-04-15 DIAGNOSIS — R062 Wheezing: Secondary | ICD-10-CM | POA: Diagnosis not present

## 2022-04-16 DIAGNOSIS — R809 Proteinuria, unspecified: Secondary | ICD-10-CM | POA: Diagnosis not present

## 2022-04-16 DIAGNOSIS — I10 Essential (primary) hypertension: Secondary | ICD-10-CM | POA: Diagnosis not present

## 2022-04-16 DIAGNOSIS — R062 Wheezing: Secondary | ICD-10-CM | POA: Diagnosis not present

## 2022-04-16 DIAGNOSIS — E785 Hyperlipidemia, unspecified: Secondary | ICD-10-CM | POA: Diagnosis not present

## 2022-04-16 DIAGNOSIS — G4089 Other seizures: Secondary | ICD-10-CM | POA: Diagnosis not present

## 2022-04-16 DIAGNOSIS — F1020 Alcohol dependence, uncomplicated: Secondary | ICD-10-CM | POA: Diagnosis not present

## 2022-04-16 DIAGNOSIS — F1120 Opioid dependence, uncomplicated: Secondary | ICD-10-CM | POA: Diagnosis not present

## 2022-04-16 DIAGNOSIS — F1520 Other stimulant dependence, uncomplicated: Secondary | ICD-10-CM | POA: Diagnosis not present

## 2022-04-16 DIAGNOSIS — F410 Panic disorder [episodic paroxysmal anxiety] without agoraphobia: Secondary | ICD-10-CM | POA: Diagnosis not present

## 2022-04-16 DIAGNOSIS — F419 Anxiety disorder, unspecified: Secondary | ICD-10-CM | POA: Diagnosis not present

## 2022-04-16 DIAGNOSIS — F17200 Nicotine dependence, unspecified, uncomplicated: Secondary | ICD-10-CM | POA: Diagnosis not present

## 2022-04-16 DIAGNOSIS — J302 Other seasonal allergic rhinitis: Secondary | ICD-10-CM | POA: Diagnosis not present

## 2022-04-16 DIAGNOSIS — F1320 Sedative, hypnotic or anxiolytic dependence, uncomplicated: Secondary | ICD-10-CM | POA: Diagnosis not present

## 2022-04-16 DIAGNOSIS — F329 Major depressive disorder, single episode, unspecified: Secondary | ICD-10-CM | POA: Diagnosis not present

## 2022-04-16 DIAGNOSIS — F1210 Cannabis abuse, uncomplicated: Secondary | ICD-10-CM | POA: Diagnosis not present

## 2022-04-16 DIAGNOSIS — E291 Testicular hypofunction: Secondary | ICD-10-CM | POA: Diagnosis not present

## 2022-04-17 DIAGNOSIS — F1320 Sedative, hypnotic or anxiolytic dependence, uncomplicated: Secondary | ICD-10-CM | POA: Diagnosis not present

## 2022-04-17 DIAGNOSIS — F1210 Cannabis abuse, uncomplicated: Secondary | ICD-10-CM | POA: Diagnosis not present

## 2022-04-17 DIAGNOSIS — F17200 Nicotine dependence, unspecified, uncomplicated: Secondary | ICD-10-CM | POA: Diagnosis not present

## 2022-04-17 DIAGNOSIS — E291 Testicular hypofunction: Secondary | ICD-10-CM | POA: Diagnosis not present

## 2022-04-17 DIAGNOSIS — F410 Panic disorder [episodic paroxysmal anxiety] without agoraphobia: Secondary | ICD-10-CM | POA: Diagnosis not present

## 2022-04-17 DIAGNOSIS — F1120 Opioid dependence, uncomplicated: Secondary | ICD-10-CM | POA: Diagnosis not present

## 2022-04-17 DIAGNOSIS — F329 Major depressive disorder, single episode, unspecified: Secondary | ICD-10-CM | POA: Diagnosis not present

## 2022-04-17 DIAGNOSIS — E785 Hyperlipidemia, unspecified: Secondary | ICD-10-CM | POA: Diagnosis not present

## 2022-04-17 DIAGNOSIS — F419 Anxiety disorder, unspecified: Secondary | ICD-10-CM | POA: Diagnosis not present

## 2022-04-17 DIAGNOSIS — I10 Essential (primary) hypertension: Secondary | ICD-10-CM | POA: Diagnosis not present

## 2022-04-17 DIAGNOSIS — R062 Wheezing: Secondary | ICD-10-CM | POA: Diagnosis not present

## 2022-04-17 DIAGNOSIS — F1020 Alcohol dependence, uncomplicated: Secondary | ICD-10-CM | POA: Diagnosis not present

## 2022-04-17 DIAGNOSIS — R809 Proteinuria, unspecified: Secondary | ICD-10-CM | POA: Diagnosis not present

## 2022-04-17 DIAGNOSIS — J302 Other seasonal allergic rhinitis: Secondary | ICD-10-CM | POA: Diagnosis not present

## 2022-04-17 DIAGNOSIS — F1520 Other stimulant dependence, uncomplicated: Secondary | ICD-10-CM | POA: Diagnosis not present

## 2022-04-17 DIAGNOSIS — G4089 Other seizures: Secondary | ICD-10-CM | POA: Diagnosis not present

## 2022-04-18 DIAGNOSIS — F329 Major depressive disorder, single episode, unspecified: Secondary | ICD-10-CM | POA: Diagnosis not present

## 2022-04-18 DIAGNOSIS — E291 Testicular hypofunction: Secondary | ICD-10-CM | POA: Diagnosis not present

## 2022-04-18 DIAGNOSIS — R062 Wheezing: Secondary | ICD-10-CM | POA: Diagnosis not present

## 2022-04-18 DIAGNOSIS — R809 Proteinuria, unspecified: Secondary | ICD-10-CM | POA: Diagnosis not present

## 2022-04-18 DIAGNOSIS — F419 Anxiety disorder, unspecified: Secondary | ICD-10-CM | POA: Diagnosis not present

## 2022-04-18 DIAGNOSIS — F17200 Nicotine dependence, unspecified, uncomplicated: Secondary | ICD-10-CM | POA: Diagnosis not present

## 2022-04-18 DIAGNOSIS — F410 Panic disorder [episodic paroxysmal anxiety] without agoraphobia: Secondary | ICD-10-CM | POA: Diagnosis not present

## 2022-04-18 DIAGNOSIS — F1520 Other stimulant dependence, uncomplicated: Secondary | ICD-10-CM | POA: Diagnosis not present

## 2022-04-18 DIAGNOSIS — F1020 Alcohol dependence, uncomplicated: Secondary | ICD-10-CM | POA: Diagnosis not present

## 2022-04-18 DIAGNOSIS — F1120 Opioid dependence, uncomplicated: Secondary | ICD-10-CM | POA: Diagnosis not present

## 2022-04-18 DIAGNOSIS — F1320 Sedative, hypnotic or anxiolytic dependence, uncomplicated: Secondary | ICD-10-CM | POA: Diagnosis not present

## 2022-04-18 DIAGNOSIS — E785 Hyperlipidemia, unspecified: Secondary | ICD-10-CM | POA: Diagnosis not present

## 2022-04-18 DIAGNOSIS — F1210 Cannabis abuse, uncomplicated: Secondary | ICD-10-CM | POA: Diagnosis not present

## 2022-04-18 DIAGNOSIS — G4089 Other seizures: Secondary | ICD-10-CM | POA: Diagnosis not present

## 2022-04-18 DIAGNOSIS — I10 Essential (primary) hypertension: Secondary | ICD-10-CM | POA: Diagnosis not present

## 2022-04-18 DIAGNOSIS — J302 Other seasonal allergic rhinitis: Secondary | ICD-10-CM | POA: Diagnosis not present

## 2022-04-19 DIAGNOSIS — F329 Major depressive disorder, single episode, unspecified: Secondary | ICD-10-CM | POA: Diagnosis not present

## 2022-04-19 DIAGNOSIS — E291 Testicular hypofunction: Secondary | ICD-10-CM | POA: Diagnosis not present

## 2022-04-19 DIAGNOSIS — F17200 Nicotine dependence, unspecified, uncomplicated: Secondary | ICD-10-CM | POA: Diagnosis not present

## 2022-04-19 DIAGNOSIS — R809 Proteinuria, unspecified: Secondary | ICD-10-CM | POA: Diagnosis not present

## 2022-04-19 DIAGNOSIS — G4089 Other seizures: Secondary | ICD-10-CM | POA: Diagnosis not present

## 2022-04-19 DIAGNOSIS — F1520 Other stimulant dependence, uncomplicated: Secondary | ICD-10-CM | POA: Diagnosis not present

## 2022-04-19 DIAGNOSIS — J302 Other seasonal allergic rhinitis: Secondary | ICD-10-CM | POA: Diagnosis not present

## 2022-04-19 DIAGNOSIS — F1120 Opioid dependence, uncomplicated: Secondary | ICD-10-CM | POA: Diagnosis not present

## 2022-04-19 DIAGNOSIS — R062 Wheezing: Secondary | ICD-10-CM | POA: Diagnosis not present

## 2022-04-19 DIAGNOSIS — F1320 Sedative, hypnotic or anxiolytic dependence, uncomplicated: Secondary | ICD-10-CM | POA: Diagnosis not present

## 2022-04-19 DIAGNOSIS — F1210 Cannabis abuse, uncomplicated: Secondary | ICD-10-CM | POA: Diagnosis not present

## 2022-04-19 DIAGNOSIS — F419 Anxiety disorder, unspecified: Secondary | ICD-10-CM | POA: Diagnosis not present

## 2022-04-19 DIAGNOSIS — F1020 Alcohol dependence, uncomplicated: Secondary | ICD-10-CM | POA: Diagnosis not present

## 2022-04-19 DIAGNOSIS — I10 Essential (primary) hypertension: Secondary | ICD-10-CM | POA: Diagnosis not present

## 2022-04-19 DIAGNOSIS — F410 Panic disorder [episodic paroxysmal anxiety] without agoraphobia: Secondary | ICD-10-CM | POA: Diagnosis not present

## 2022-04-19 DIAGNOSIS — E785 Hyperlipidemia, unspecified: Secondary | ICD-10-CM | POA: Diagnosis not present

## 2022-04-20 DIAGNOSIS — R809 Proteinuria, unspecified: Secondary | ICD-10-CM | POA: Diagnosis not present

## 2022-04-20 DIAGNOSIS — F1120 Opioid dependence, uncomplicated: Secondary | ICD-10-CM | POA: Diagnosis not present

## 2022-04-20 DIAGNOSIS — G4089 Other seizures: Secondary | ICD-10-CM | POA: Diagnosis not present

## 2022-04-20 DIAGNOSIS — F17200 Nicotine dependence, unspecified, uncomplicated: Secondary | ICD-10-CM | POA: Diagnosis not present

## 2022-04-20 DIAGNOSIS — F329 Major depressive disorder, single episode, unspecified: Secondary | ICD-10-CM | POA: Diagnosis not present

## 2022-04-20 DIAGNOSIS — E785 Hyperlipidemia, unspecified: Secondary | ICD-10-CM | POA: Diagnosis not present

## 2022-04-20 DIAGNOSIS — F419 Anxiety disorder, unspecified: Secondary | ICD-10-CM | POA: Diagnosis not present

## 2022-04-20 DIAGNOSIS — F1520 Other stimulant dependence, uncomplicated: Secondary | ICD-10-CM | POA: Diagnosis not present

## 2022-04-20 DIAGNOSIS — E291 Testicular hypofunction: Secondary | ICD-10-CM | POA: Diagnosis not present

## 2022-04-20 DIAGNOSIS — F410 Panic disorder [episodic paroxysmal anxiety] without agoraphobia: Secondary | ICD-10-CM | POA: Diagnosis not present

## 2022-04-20 DIAGNOSIS — F1020 Alcohol dependence, uncomplicated: Secondary | ICD-10-CM | POA: Diagnosis not present

## 2022-04-20 DIAGNOSIS — J302 Other seasonal allergic rhinitis: Secondary | ICD-10-CM | POA: Diagnosis not present

## 2022-04-20 DIAGNOSIS — I10 Essential (primary) hypertension: Secondary | ICD-10-CM | POA: Diagnosis not present

## 2022-04-20 DIAGNOSIS — R062 Wheezing: Secondary | ICD-10-CM | POA: Diagnosis not present

## 2022-04-20 DIAGNOSIS — F1320 Sedative, hypnotic or anxiolytic dependence, uncomplicated: Secondary | ICD-10-CM | POA: Diagnosis not present

## 2022-04-20 DIAGNOSIS — F1210 Cannabis abuse, uncomplicated: Secondary | ICD-10-CM | POA: Diagnosis not present

## 2022-04-21 DIAGNOSIS — E785 Hyperlipidemia, unspecified: Secondary | ICD-10-CM | POA: Diagnosis not present

## 2022-04-21 DIAGNOSIS — F17200 Nicotine dependence, unspecified, uncomplicated: Secondary | ICD-10-CM | POA: Diagnosis not present

## 2022-04-21 DIAGNOSIS — F329 Major depressive disorder, single episode, unspecified: Secondary | ICD-10-CM | POA: Diagnosis not present

## 2022-04-21 DIAGNOSIS — F1320 Sedative, hypnotic or anxiolytic dependence, uncomplicated: Secondary | ICD-10-CM | POA: Diagnosis not present

## 2022-04-21 DIAGNOSIS — I10 Essential (primary) hypertension: Secondary | ICD-10-CM | POA: Diagnosis not present

## 2022-04-21 DIAGNOSIS — G4089 Other seizures: Secondary | ICD-10-CM | POA: Diagnosis not present

## 2022-04-21 DIAGNOSIS — F1120 Opioid dependence, uncomplicated: Secondary | ICD-10-CM | POA: Diagnosis not present

## 2022-04-21 DIAGNOSIS — J302 Other seasonal allergic rhinitis: Secondary | ICD-10-CM | POA: Diagnosis not present

## 2022-04-21 DIAGNOSIS — R809 Proteinuria, unspecified: Secondary | ICD-10-CM | POA: Diagnosis not present

## 2022-04-21 DIAGNOSIS — R062 Wheezing: Secondary | ICD-10-CM | POA: Diagnosis not present

## 2022-04-21 DIAGNOSIS — F1520 Other stimulant dependence, uncomplicated: Secondary | ICD-10-CM | POA: Diagnosis not present

## 2022-04-21 DIAGNOSIS — F1020 Alcohol dependence, uncomplicated: Secondary | ICD-10-CM | POA: Diagnosis not present

## 2022-04-21 DIAGNOSIS — F1210 Cannabis abuse, uncomplicated: Secondary | ICD-10-CM | POA: Diagnosis not present

## 2022-04-21 DIAGNOSIS — E291 Testicular hypofunction: Secondary | ICD-10-CM | POA: Diagnosis not present

## 2022-04-21 DIAGNOSIS — F419 Anxiety disorder, unspecified: Secondary | ICD-10-CM | POA: Diagnosis not present

## 2022-04-21 DIAGNOSIS — F410 Panic disorder [episodic paroxysmal anxiety] without agoraphobia: Secondary | ICD-10-CM | POA: Diagnosis not present

## 2022-04-22 DIAGNOSIS — F1020 Alcohol dependence, uncomplicated: Secondary | ICD-10-CM | POA: Diagnosis not present

## 2022-04-22 DIAGNOSIS — F17200 Nicotine dependence, unspecified, uncomplicated: Secondary | ICD-10-CM | POA: Diagnosis not present

## 2022-04-22 DIAGNOSIS — E785 Hyperlipidemia, unspecified: Secondary | ICD-10-CM | POA: Diagnosis not present

## 2022-04-22 DIAGNOSIS — F329 Major depressive disorder, single episode, unspecified: Secondary | ICD-10-CM | POA: Diagnosis not present

## 2022-04-22 DIAGNOSIS — E291 Testicular hypofunction: Secondary | ICD-10-CM | POA: Diagnosis not present

## 2022-04-22 DIAGNOSIS — G4089 Other seizures: Secondary | ICD-10-CM | POA: Diagnosis not present

## 2022-04-22 DIAGNOSIS — F1320 Sedative, hypnotic or anxiolytic dependence, uncomplicated: Secondary | ICD-10-CM | POA: Diagnosis not present

## 2022-04-22 DIAGNOSIS — R809 Proteinuria, unspecified: Secondary | ICD-10-CM | POA: Diagnosis not present

## 2022-04-22 DIAGNOSIS — R062 Wheezing: Secondary | ICD-10-CM | POA: Diagnosis not present

## 2022-04-22 DIAGNOSIS — F1210 Cannabis abuse, uncomplicated: Secondary | ICD-10-CM | POA: Diagnosis not present

## 2022-04-22 DIAGNOSIS — I10 Essential (primary) hypertension: Secondary | ICD-10-CM | POA: Diagnosis not present

## 2022-04-22 DIAGNOSIS — F410 Panic disorder [episodic paroxysmal anxiety] without agoraphobia: Secondary | ICD-10-CM | POA: Diagnosis not present

## 2022-04-22 DIAGNOSIS — F1520 Other stimulant dependence, uncomplicated: Secondary | ICD-10-CM | POA: Diagnosis not present

## 2022-04-22 DIAGNOSIS — F1120 Opioid dependence, uncomplicated: Secondary | ICD-10-CM | POA: Diagnosis not present

## 2022-04-22 DIAGNOSIS — J302 Other seasonal allergic rhinitis: Secondary | ICD-10-CM | POA: Diagnosis not present

## 2022-04-22 DIAGNOSIS — F419 Anxiety disorder, unspecified: Secondary | ICD-10-CM | POA: Diagnosis not present

## 2022-04-23 DIAGNOSIS — F1520 Other stimulant dependence, uncomplicated: Secondary | ICD-10-CM | POA: Diagnosis not present

## 2022-04-23 DIAGNOSIS — F419 Anxiety disorder, unspecified: Secondary | ICD-10-CM | POA: Diagnosis not present

## 2022-04-23 DIAGNOSIS — E291 Testicular hypofunction: Secondary | ICD-10-CM | POA: Diagnosis not present

## 2022-04-23 DIAGNOSIS — E785 Hyperlipidemia, unspecified: Secondary | ICD-10-CM | POA: Diagnosis not present

## 2022-04-23 DIAGNOSIS — F1320 Sedative, hypnotic or anxiolytic dependence, uncomplicated: Secondary | ICD-10-CM | POA: Diagnosis not present

## 2022-04-23 DIAGNOSIS — F17200 Nicotine dependence, unspecified, uncomplicated: Secondary | ICD-10-CM | POA: Diagnosis not present

## 2022-04-23 DIAGNOSIS — R809 Proteinuria, unspecified: Secondary | ICD-10-CM | POA: Diagnosis not present

## 2022-04-23 DIAGNOSIS — J302 Other seasonal allergic rhinitis: Secondary | ICD-10-CM | POA: Diagnosis not present

## 2022-04-23 DIAGNOSIS — F1120 Opioid dependence, uncomplicated: Secondary | ICD-10-CM | POA: Diagnosis not present

## 2022-04-23 DIAGNOSIS — F1210 Cannabis abuse, uncomplicated: Secondary | ICD-10-CM | POA: Diagnosis not present

## 2022-04-23 DIAGNOSIS — F1020 Alcohol dependence, uncomplicated: Secondary | ICD-10-CM | POA: Diagnosis not present

## 2022-04-23 DIAGNOSIS — I10 Essential (primary) hypertension: Secondary | ICD-10-CM | POA: Diagnosis not present

## 2022-04-23 DIAGNOSIS — R062 Wheezing: Secondary | ICD-10-CM | POA: Diagnosis not present

## 2022-04-23 DIAGNOSIS — F329 Major depressive disorder, single episode, unspecified: Secondary | ICD-10-CM | POA: Diagnosis not present

## 2022-04-23 DIAGNOSIS — F410 Panic disorder [episodic paroxysmal anxiety] without agoraphobia: Secondary | ICD-10-CM | POA: Diagnosis not present

## 2022-04-23 DIAGNOSIS — G4089 Other seizures: Secondary | ICD-10-CM | POA: Diagnosis not present

## 2022-04-24 DIAGNOSIS — R062 Wheezing: Secondary | ICD-10-CM | POA: Diagnosis not present

## 2022-04-24 DIAGNOSIS — F1120 Opioid dependence, uncomplicated: Secondary | ICD-10-CM | POA: Diagnosis not present

## 2022-04-24 DIAGNOSIS — F329 Major depressive disorder, single episode, unspecified: Secondary | ICD-10-CM | POA: Diagnosis not present

## 2022-04-24 DIAGNOSIS — F1020 Alcohol dependence, uncomplicated: Secondary | ICD-10-CM | POA: Diagnosis not present

## 2022-04-24 DIAGNOSIS — F1520 Other stimulant dependence, uncomplicated: Secondary | ICD-10-CM | POA: Diagnosis not present

## 2022-04-24 DIAGNOSIS — F410 Panic disorder [episodic paroxysmal anxiety] without agoraphobia: Secondary | ICD-10-CM | POA: Diagnosis not present

## 2022-04-24 DIAGNOSIS — E785 Hyperlipidemia, unspecified: Secondary | ICD-10-CM | POA: Diagnosis not present

## 2022-04-24 DIAGNOSIS — I10 Essential (primary) hypertension: Secondary | ICD-10-CM | POA: Diagnosis not present

## 2022-04-24 DIAGNOSIS — E291 Testicular hypofunction: Secondary | ICD-10-CM | POA: Diagnosis not present

## 2022-04-24 DIAGNOSIS — R809 Proteinuria, unspecified: Secondary | ICD-10-CM | POA: Diagnosis not present

## 2022-04-24 DIAGNOSIS — F419 Anxiety disorder, unspecified: Secondary | ICD-10-CM | POA: Diagnosis not present

## 2022-04-24 DIAGNOSIS — J302 Other seasonal allergic rhinitis: Secondary | ICD-10-CM | POA: Diagnosis not present

## 2022-04-24 DIAGNOSIS — F1320 Sedative, hypnotic or anxiolytic dependence, uncomplicated: Secondary | ICD-10-CM | POA: Diagnosis not present

## 2022-04-24 DIAGNOSIS — G4089 Other seizures: Secondary | ICD-10-CM | POA: Diagnosis not present

## 2022-04-24 DIAGNOSIS — F17200 Nicotine dependence, unspecified, uncomplicated: Secondary | ICD-10-CM | POA: Diagnosis not present

## 2022-04-24 DIAGNOSIS — F1210 Cannabis abuse, uncomplicated: Secondary | ICD-10-CM | POA: Diagnosis not present

## 2022-04-25 DIAGNOSIS — G4089 Other seizures: Secondary | ICD-10-CM | POA: Diagnosis not present

## 2022-04-25 DIAGNOSIS — F419 Anxiety disorder, unspecified: Secondary | ICD-10-CM | POA: Diagnosis not present

## 2022-04-25 DIAGNOSIS — J302 Other seasonal allergic rhinitis: Secondary | ICD-10-CM | POA: Diagnosis not present

## 2022-04-25 DIAGNOSIS — F1210 Cannabis abuse, uncomplicated: Secondary | ICD-10-CM | POA: Diagnosis not present

## 2022-04-25 DIAGNOSIS — I10 Essential (primary) hypertension: Secondary | ICD-10-CM | POA: Diagnosis not present

## 2022-04-25 DIAGNOSIS — F329 Major depressive disorder, single episode, unspecified: Secondary | ICD-10-CM | POA: Diagnosis not present

## 2022-04-25 DIAGNOSIS — R809 Proteinuria, unspecified: Secondary | ICD-10-CM | POA: Diagnosis not present

## 2022-04-25 DIAGNOSIS — F17200 Nicotine dependence, unspecified, uncomplicated: Secondary | ICD-10-CM | POA: Diagnosis not present

## 2022-04-25 DIAGNOSIS — E785 Hyperlipidemia, unspecified: Secondary | ICD-10-CM | POA: Diagnosis not present

## 2022-04-25 DIAGNOSIS — F1320 Sedative, hypnotic or anxiolytic dependence, uncomplicated: Secondary | ICD-10-CM | POA: Diagnosis not present

## 2022-04-25 DIAGNOSIS — F1120 Opioid dependence, uncomplicated: Secondary | ICD-10-CM | POA: Diagnosis not present

## 2022-04-25 DIAGNOSIS — F410 Panic disorder [episodic paroxysmal anxiety] without agoraphobia: Secondary | ICD-10-CM | POA: Diagnosis not present

## 2022-04-25 DIAGNOSIS — F1020 Alcohol dependence, uncomplicated: Secondary | ICD-10-CM | POA: Diagnosis not present

## 2022-04-25 DIAGNOSIS — F1520 Other stimulant dependence, uncomplicated: Secondary | ICD-10-CM | POA: Diagnosis not present

## 2022-04-25 DIAGNOSIS — R062 Wheezing: Secondary | ICD-10-CM | POA: Diagnosis not present

## 2022-04-25 DIAGNOSIS — E291 Testicular hypofunction: Secondary | ICD-10-CM | POA: Diagnosis not present

## 2022-04-26 DIAGNOSIS — E785 Hyperlipidemia, unspecified: Secondary | ICD-10-CM | POA: Diagnosis not present

## 2022-04-26 DIAGNOSIS — F1520 Other stimulant dependence, uncomplicated: Secondary | ICD-10-CM | POA: Diagnosis not present

## 2022-04-26 DIAGNOSIS — R809 Proteinuria, unspecified: Secondary | ICD-10-CM | POA: Diagnosis not present

## 2022-04-26 DIAGNOSIS — R062 Wheezing: Secondary | ICD-10-CM | POA: Diagnosis not present

## 2022-04-26 DIAGNOSIS — J302 Other seasonal allergic rhinitis: Secondary | ICD-10-CM | POA: Diagnosis not present

## 2022-04-26 DIAGNOSIS — G4089 Other seizures: Secondary | ICD-10-CM | POA: Diagnosis not present

## 2022-04-26 DIAGNOSIS — I10 Essential (primary) hypertension: Secondary | ICD-10-CM | POA: Diagnosis not present

## 2022-04-26 DIAGNOSIS — F1120 Opioid dependence, uncomplicated: Secondary | ICD-10-CM | POA: Diagnosis not present

## 2022-04-26 DIAGNOSIS — F1320 Sedative, hypnotic or anxiolytic dependence, uncomplicated: Secondary | ICD-10-CM | POA: Diagnosis not present

## 2022-04-26 DIAGNOSIS — F410 Panic disorder [episodic paroxysmal anxiety] without agoraphobia: Secondary | ICD-10-CM | POA: Diagnosis not present

## 2022-04-26 DIAGNOSIS — F17200 Nicotine dependence, unspecified, uncomplicated: Secondary | ICD-10-CM | POA: Diagnosis not present

## 2022-04-26 DIAGNOSIS — E291 Testicular hypofunction: Secondary | ICD-10-CM | POA: Diagnosis not present

## 2022-04-26 DIAGNOSIS — F329 Major depressive disorder, single episode, unspecified: Secondary | ICD-10-CM | POA: Diagnosis not present

## 2022-04-26 DIAGNOSIS — F1210 Cannabis abuse, uncomplicated: Secondary | ICD-10-CM | POA: Diagnosis not present

## 2022-04-26 DIAGNOSIS — F419 Anxiety disorder, unspecified: Secondary | ICD-10-CM | POA: Diagnosis not present

## 2022-04-26 DIAGNOSIS — F1020 Alcohol dependence, uncomplicated: Secondary | ICD-10-CM | POA: Diagnosis not present

## 2022-05-05 DIAGNOSIS — I10 Essential (primary) hypertension: Secondary | ICD-10-CM | POA: Diagnosis not present

## 2022-05-05 DIAGNOSIS — F17200 Nicotine dependence, unspecified, uncomplicated: Secondary | ICD-10-CM | POA: Diagnosis not present

## 2022-05-05 DIAGNOSIS — E782 Mixed hyperlipidemia: Secondary | ICD-10-CM | POA: Diagnosis not present

## 2022-05-05 DIAGNOSIS — G8929 Other chronic pain: Secondary | ICD-10-CM | POA: Diagnosis not present

## 2022-06-13 DIAGNOSIS — F411 Generalized anxiety disorder: Secondary | ICD-10-CM | POA: Diagnosis not present

## 2022-06-20 DIAGNOSIS — F411 Generalized anxiety disorder: Secondary | ICD-10-CM | POA: Diagnosis not present

## 2022-06-27 ENCOUNTER — Telehealth: Payer: Self-pay | Admitting: Neurology

## 2022-06-27 ENCOUNTER — Encounter: Payer: Self-pay | Admitting: Neurology

## 2022-06-27 DIAGNOSIS — F411 Generalized anxiety disorder: Secondary | ICD-10-CM | POA: Diagnosis not present

## 2022-06-27 NOTE — Telephone Encounter (Signed)
Pt called stating that his 6 month wait to drive is coming to an end and he is wanting to know if the provider can release him so that he can start driving again. Spoke to provider and he will be sending pt a mychart.

## 2022-07-12 DIAGNOSIS — F411 Generalized anxiety disorder: Secondary | ICD-10-CM | POA: Diagnosis not present

## 2022-07-21 ENCOUNTER — Ambulatory Visit (INDEPENDENT_AMBULATORY_CARE_PROVIDER_SITE_OTHER): Payer: BC Managed Care – PPO | Admitting: Neurology

## 2022-07-21 ENCOUNTER — Encounter: Payer: Self-pay | Admitting: Neurology

## 2022-07-21 VITALS — BP 151/95 | HR 97 | Ht 67.0 in | Wt 225.0 lb

## 2022-07-21 DIAGNOSIS — G40909 Epilepsy, unspecified, not intractable, without status epilepticus: Secondary | ICD-10-CM

## 2022-07-21 NOTE — Progress Notes (Unsigned)
[facility]  PATIENT: Marvin Cooper DOB: Oct 02, 1971  REQUESTING CLINICIAN: [facility]* HISTORY FROM: Patient and spouse  REASON FOR VISIT: Seizure    HISTORICAL  CHIEF COMPLAINT:  Chief Complaint  Patient presents with   Follow-up    Rm 12. Alone. Denies any new seizure activity. Checked into [facility] approx 4 months ago. Has been doing well.    INTERVAL HISTORY 07/21/2022:  Patient presents today for follow-up, states that since last visit in March, no additional seizures.  He had  to check himself into rehab to help with kratom use.  He also mentioned that rehab has helped him with his alcohol use and depression.  Currently he feels much better, denies any seizures denies any other symptoms.    HISTORY OF PRESENT ILLNESS:  This is a 51 year old gentleman past medical history of generalized anxiety disorder, ADD and hyperlipidemia who is presenting after a seizure on February 19.  Patient reports sitting at home eating dinner, watching TV and next thing that he remembers is wife standing over her with EMS around.  He was confused for a minute.  He reported he was back to his normal self a couple hours later in the ED. Wife reported she was upstairs, she hear a weird noise, called patient but he did not respond.  She went downstairs and found him in a very tense position with eyes open and unresponsive.  She thought initially that he was choking.  She tried the Heimlich maneuver but noted that he was breathing then she called EMS.  Episode lasted a few minutes.  In the ED labs were within normal limits, patient does take benzodiazepine, and also during this time he reported taking more than normal when his anxiety is worse.  He was also taking kratom supplement for his anxiety, states that some days he will take more than other days. Patient reported 5 years ago, he did have a seizure in the setting of alcohol withdrawal.  He was doing alcohol  detox, went to rehab, he was started on medication to prevent alcohol withdrawal seizure but he had one.  That was his first seizure and none since.     Handedness: Right handed   Onset: Feb 19, 5 years ago had alcohol withdrawal seizures  Seizure Type: Generalized   Current frequency: 2/19  Any injuries from seizures: Tongue biting, left shoulder injury  Seizure risk factors: None reported   Previous ASMs: None   Currenty ASMs: None   ASMs side effects: N/A   Brain Images: Normal head CT  Previous EEGs: None   OTHER MEDICAL CONDITIONS: Hyperlipidemia, ADD, Anxiety   REVIEW OF SYSTEMS: Full 14 system review of systems performed and negative with exception of: as noted in the HPI   ALLERGIES: No Known Allergies  HOME MEDICATIONS: Outpatient Medications Prior to Visit  Medication Sig Dispense Refill   albuterol (PROVENTIL HFA;VENTOLIN HFA) 108 (90 BASE) MCG/ACT inhaler Inhale 2 puffs into the lungs every 4 (four) hours as needed for wheezing or shortness of breath.     atomoxetine (STRATTERA) 80 MG capsule Take 80 mg by mouth every morning.     benazepril (LOTENSIN) 40 MG tablet Take 40 mg by mouth daily.     desvenlafaxine (PRISTIQ) 100 MG 24 hr tablet Take 100 mg by mouth every morning.     fluticasone (FLONASE) 50 MCG/ACT nasal spray Place 1 spray into both nostrils daily as needed for allergies.     methocarbamol (ROBAXIN)  750 MG tablet Take 750 mg by mouth as needed.     mirtazapine (REMERON) 15 MG tablet Take 15 mg by mouth at bedtime.     QUEtiapine (SEROQUEL) 200 MG tablet Take 200 mg by mouth at bedtime.     simvastatin (ZOCOR) 40 MG tablet Take 40 mg by mouth at bedtime.     testosterone cypionate (DEPOTESTOSTERONE CYPIONATE) 200 MG/ML injection Inject into the muscle once a week. 0.6 ml     amphetamine-dextroamphetamine (ADDERALL XR) 20 MG 24 hr capsule Take 20 mg by mouth 3 (three) times daily.     clonazePAM (KLONOPIN) 0.5 MG tablet Take 0.5 mg by mouth 4  (four) times daily as needed for anxiety.     OVER THE COUNTER MEDICATION Take 1 Scoop by mouth 3 (three) times daily. kratom     oxyCODONE-acetaminophen (PERCOCET) 10-325 MG tablet Take 1 tablet by mouth every 8 (eight) hours.     No facility-administered medications prior to visit.    PAST MEDICAL HISTORY: Past Medical History:  Diagnosis Date   ADD (attention deficit disorder)    Alcohol abuse    Anxiety    Asthma    Chronic back pain    Depression    Hypercholesteremia    Hypertension    Low testosterone    Seasonal allergies     PAST SURGICAL HISTORY: Past Surgical History:  Procedure Laterality Date   ADENOIDECTOMY  Age 51   KNEE SURGERY     LEG SURGERY Left Age 27   ORIF lower leg   SHOULDER ARTHROSCOPY      FAMILY HISTORY: Family History  Problem Relation Age of Onset   Hypertension Mother    Other Father        died in accident   Hypertension Sister    Alcohol abuse Sister    Alcohol abuse Brother    Heart attack Brother    Alcohol abuse Maternal Grandfather     SOCIAL HISTORY: Social History   Socioeconomic History   Marital status: Married    Spouse name: Not on file   Number of children: 2   Years of education: some college   Highest education level: Not on file  Occupational History    Employer: [employer]   Occupation: office work  Tobacco Use   Smoking status: Every Day    Packs/day: 0.50    Years: 20.00    Total pack years: 10.00    Types: Cigarettes   Smokeless tobacco: Never  Substance and Sexual Activity   Alcohol use: Not on file    Comment: 01/12/22 - reports 6-7 years sober   Drug use: Yes    Types: Marijuana    Comment: occasional use   Sexual activity: Yes    Partners: Female  Other Topics Concern   Not on file  Social History Narrative   Grew up in [place]. Moved to [place] in 1997. High school graduate.  Trade school education in welding. Married twice, divorced once. Son by first marriage, daughter by  second marriage. He enjoys riding Interior and spatial designer.   Right-handed.   Six cups caffeine per day.   Social Determinants of Health   Financial Resource Strain: Not on file  Food Insecurity: Not on file  Transportation Needs: Not on file  Physical Activity: Not on file  Stress: Not on file  Social Connections: Not on file  Intimate Partner Violence: Not on file    PHYSICAL EXAM  GENERAL EXAM/CONSTITUTIONAL: Vitals:  Vitals:   07/21/22 1453  BP: (!) 151/95  Pulse: 97  Weight: 225 lb (102.1 kg)  Height: 5\' 7"  (1.702 m)    Body mass index is 35.24 kg/m. Wt Readings from Last 3 Encounters:  07/21/22 225 lb (102.1 kg)  01/12/22 197 lb (89.4 kg)  07/09/16 210 lb (95.3 kg)   Patient is in no distress; well developed, nourished and groomed; neck is supple  CARDIOVASCULAR: Examination of carotid arteries is normal; no carotid bruits Regular rate and rhythm, no murmurs Examination of peripheral vascular system by observation and palpation is normal  EYES: Pupils round and reactive to light, Visual fields full to confrontation, Extraocular movements intacts,  No results found.  MUSCULOSKELETAL: Gait, strength, tone, movements noted in Neurologic exam below  NEUROLOGIC: MENTAL STATUS:      No data to display         awake, alert, oriented to person, place and time recent and remote memory intact normal attention and concentration language fluent, comprehension intact, naming intact fund of knowledge appropriate  CRANIAL NERVE: 2nd, 3rd, 4th, 6th - pupils equal and reactive to light, visual fields full to confrontation, extraocular muscles intact, no nystagmus 5th - facial sensation symmetric 7th - facial strength symmetric 8th - hearing intact 9th - palate elevates symmetrically, uvula midline 11th - shoulder shrug symmetric 12th - tongue protrusion midline  MOTOR:  normal bulk and tone, full strength in the BUE, BLE  SENSORY:  normal and symmetric to  light touch, pinprick, temperature, vibration  COORDINATION:  finger-nose-finger, fine finger movements normal  REFLEXES:  deep tendon reflexes present and symmetric  GAIT/STATION:  normal   DIAGNOSTIC DATA (LABS, IMAGING, TESTING) - I reviewed patient records, labs, notes, testing and imaging myself where available.  Lab Results  Component Value Date   WBC 18.7 (H) 01/02/2022   HGB 18.4 (H) 01/03/2022   HCT 54.0 (H) 01/03/2022   MCV 89.8 01/02/2022   PLT 241 01/02/2022      Component Value Date/Time   NA 139 01/03/2022 0011   K 3.8 01/03/2022 0011   CL 101 01/02/2022 2138   CO2 25 01/02/2022 2138   GLUCOSE 115 (H) 01/02/2022 2138   BUN 7 01/02/2022 2138   CREATININE 0.86 01/02/2022 2138   CALCIUM 8.0 (L) 01/02/2022 2138   PROT 6.3 (L) 01/02/2022 2138   ALBUMIN 3.7 01/02/2022 2138   AST 23 01/02/2022 2138   ALT 20 01/02/2022 2138   ALKPHOS 83 01/02/2022 2138   BILITOT 0.4 01/02/2022 2138   GFRNONAA >60 01/02/2022 2138   GFRAA >60 07/11/2016 0120   No results found for: "CHOL", "HDL", "LDLCALC", "LDLDIRECT", "TRIG" No results found for: "HGBA1C" No results found for: "VITAMINB12" Lab Results  Component Value Date   TSH 2.558 01/02/2022    Head CT 2/19 No acute intracranial abnormality.  EEG 01/19/2022: Normal    ASSESSMENT AND PLAN  51 y.o. year old male  with past medical history of anxiety disorder, ADD and hyperlipidemia who is presenting for follow-up, no seizures since last visit.  States that rehab has helped him to mostly with his alcohol use, kratom use and depression.  Currently he stopped using those substances, doing much better.  Enjoys his works. Continue to follow-up with PCP and return as needed.   1. Seizure disorder [facility])     Patient Instructions  Continue current medications Follow-up with PCP Return as needed   Per [facility] statutes, patients with seizures are not allowed to drive until  they have been seizure-free for  six months.  Other recommendations include using caution when using heavy equipment or power tools. Avoid working on ladders or at heights. Take showers instead of baths.  Do not swim alone.  Ensure the water temperature is not too high on the home water heater. Do not go swimming alone. Do not lock yourself in a room alone (i.e. bathroom). When caring for infants or small children, sit down when holding, feeding, or changing withdrawal versus to minimize risk of injury to the child in the event you have a seizure. Maintain good sleep hygiene. Avoid alcohol.  Also recommend adequate sleep, hydration, good diet and minimize stress.   During the Seizure  - First, ensure adequate ventilation and place patients on the floor on their left side  Loosen clothing around the neck and ensure the airway is patent. If the patient is clenching the teeth, do not force the mouth open with any object as this can cause severe damage - Remove all items from the surrounding that can be hazardous. The patient may be oblivious to what's happening and may not even know what he or she is doing. If the patient is confused and wandering, either gently guide him/her away and block access to outside areas - Reassure the individual and be comforting - Call 911. In most cases, the seizure ends before EMS arrives. However, there are cases when seizures may last over 3 to 5 minutes. Or the individual may have developed breathing difficulties or severe injuries. If a pregnant patient or a person with diabetes develops a seizure, it is prudent to call an ambulance. - Finally, if the patient does not regain full consciousness, then call EMS. Most patients will remain confused for about 45 to 90 minutes after a seizure, so you must use judgment in calling for help. - Avoid restraints but make sure the patient is in a bed with padded side rails - Place the individual in a lateral position with the neck slightly flexed; this will help the  saliva drain from the mouth and prevent the tongue from falling backward - Remove all nearby furniture and other hazards from the area - Provide verbal assurance as the individual is regaining consciousness - Provide the patient with privacy if possible - Call for help and start treatment as ordered by the caregiver   After the Seizure (Postictal Stage)  After a seizure, most patients experience confusion, fatigue, muscle pain and/or a headache. Thus, one should permit the individual to sleep. For the next few days, reassurance is essential. Being calm and helping reorient the person is also of importance.  Most seizures are painless and end spontaneously. Seizures are not harmful to others but can lead to complications such as stress on the lungs, brain and the heart. Individuals with prior lung problems may develop labored breathing and respiratory distress.     No orders of the defined types were placed in this encounter.   No orders of the defined types were placed in this encounter.   Return if symptoms worsen or fail to improve.    Windell Norfolk, MD 07/24/2022, 6:21 PM  [facility] [address] [place], [place] [address] [phone]

## 2022-07-24 NOTE — Patient Instructions (Signed)
Continue current medications Follow-up with PCP Return as needed. 

## 2022-07-25 DIAGNOSIS — R198 Other specified symptoms and signs involving the digestive system and abdomen: Secondary | ICD-10-CM | POA: Diagnosis not present

## 2022-07-25 DIAGNOSIS — I10 Essential (primary) hypertension: Secondary | ICD-10-CM | POA: Diagnosis not present

## 2022-07-25 DIAGNOSIS — R3912 Poor urinary stream: Secondary | ICD-10-CM | POA: Diagnosis not present

## 2022-07-25 DIAGNOSIS — E291 Testicular hypofunction: Secondary | ICD-10-CM | POA: Diagnosis not present

## 2022-07-26 DIAGNOSIS — F411 Generalized anxiety disorder: Secondary | ICD-10-CM | POA: Diagnosis not present

## 2022-08-04 DIAGNOSIS — F411 Generalized anxiety disorder: Secondary | ICD-10-CM | POA: Diagnosis not present

## 2022-08-05 DIAGNOSIS — R7303 Prediabetes: Secondary | ICD-10-CM | POA: Diagnosis not present

## 2022-08-05 DIAGNOSIS — I10 Essential (primary) hypertension: Secondary | ICD-10-CM | POA: Diagnosis not present

## 2022-08-05 DIAGNOSIS — E291 Testicular hypofunction: Secondary | ICD-10-CM | POA: Diagnosis not present

## 2022-08-05 DIAGNOSIS — E782 Mixed hyperlipidemia: Secondary | ICD-10-CM | POA: Diagnosis not present

## 2022-08-22 DIAGNOSIS — M25511 Pain in right shoulder: Secondary | ICD-10-CM | POA: Diagnosis not present

## 2022-08-24 DIAGNOSIS — F411 Generalized anxiety disorder: Secondary | ICD-10-CM | POA: Diagnosis not present

## 2022-09-05 DIAGNOSIS — Z03818 Encounter for observation for suspected exposure to other biological agents ruled out: Secondary | ICD-10-CM | POA: Diagnosis not present

## 2022-09-05 DIAGNOSIS — R062 Wheezing: Secondary | ICD-10-CM | POA: Diagnosis not present

## 2022-09-05 DIAGNOSIS — J029 Acute pharyngitis, unspecified: Secondary | ICD-10-CM | POA: Diagnosis not present

## 2022-09-05 DIAGNOSIS — J22 Unspecified acute lower respiratory infection: Secondary | ICD-10-CM | POA: Diagnosis not present

## 2022-09-05 DIAGNOSIS — H66003 Acute suppurative otitis media without spontaneous rupture of ear drum, bilateral: Secondary | ICD-10-CM | POA: Diagnosis not present

## 2022-09-22 DIAGNOSIS — F411 Generalized anxiety disorder: Secondary | ICD-10-CM | POA: Diagnosis not present

## 2022-10-13 DIAGNOSIS — F411 Generalized anxiety disorder: Secondary | ICD-10-CM | POA: Diagnosis not present

## 2022-10-27 DIAGNOSIS — F411 Generalized anxiety disorder: Secondary | ICD-10-CM | POA: Diagnosis not present

## 2022-11-10 DIAGNOSIS — F411 Generalized anxiety disorder: Secondary | ICD-10-CM | POA: Diagnosis not present

## 2022-11-22 DIAGNOSIS — F411 Generalized anxiety disorder: Secondary | ICD-10-CM | POA: Diagnosis not present

## 2022-12-12 DIAGNOSIS — K640 First degree hemorrhoids: Secondary | ICD-10-CM | POA: Diagnosis not present

## 2022-12-12 DIAGNOSIS — D122 Benign neoplasm of ascending colon: Secondary | ICD-10-CM | POA: Diagnosis not present

## 2022-12-12 DIAGNOSIS — Z86010 Personal history of colonic polyps: Secondary | ICD-10-CM | POA: Diagnosis not present

## 2022-12-12 DIAGNOSIS — K5730 Diverticulosis of large intestine without perforation or abscess without bleeding: Secondary | ICD-10-CM | POA: Diagnosis not present

## 2022-12-12 DIAGNOSIS — D125 Benign neoplasm of sigmoid colon: Secondary | ICD-10-CM | POA: Diagnosis not present

## 2022-12-12 DIAGNOSIS — D120 Benign neoplasm of cecum: Secondary | ICD-10-CM | POA: Diagnosis not present

## 2022-12-19 DIAGNOSIS — F411 Generalized anxiety disorder: Secondary | ICD-10-CM | POA: Diagnosis not present

## 2023-01-16 DIAGNOSIS — F411 Generalized anxiety disorder: Secondary | ICD-10-CM | POA: Diagnosis not present

## 2023-01-20 DIAGNOSIS — F419 Anxiety disorder, unspecified: Secondary | ICD-10-CM | POA: Diagnosis not present

## 2023-01-20 DIAGNOSIS — R7303 Prediabetes: Secondary | ICD-10-CM | POA: Diagnosis not present

## 2023-01-20 DIAGNOSIS — I10 Essential (primary) hypertension: Secondary | ICD-10-CM | POA: Diagnosis not present

## 2023-01-20 DIAGNOSIS — E291 Testicular hypofunction: Secondary | ICD-10-CM | POA: Diagnosis not present

## 2023-01-20 DIAGNOSIS — E782 Mixed hyperlipidemia: Secondary | ICD-10-CM | POA: Diagnosis not present

## 2023-01-31 DIAGNOSIS — F411 Generalized anxiety disorder: Secondary | ICD-10-CM | POA: Diagnosis not present

## 2023-02-28 DIAGNOSIS — F411 Generalized anxiety disorder: Secondary | ICD-10-CM | POA: Diagnosis not present

## 2023-03-13 DIAGNOSIS — F411 Generalized anxiety disorder: Secondary | ICD-10-CM | POA: Diagnosis not present

## 2023-03-24 IMAGING — CT CT HEAD W/O CM
4 series · 16 of 47 positions shown, 18 images · non-contrast
Comparison: June 30, 2015

CLINICAL DATA: Possible seizure.



[Series 3: head without · axial · non-contrast · 0.46mm/px · z∈[-105,+15]mm · 7 of 32 slices shown, 9 images]
[im 4/32  brain]
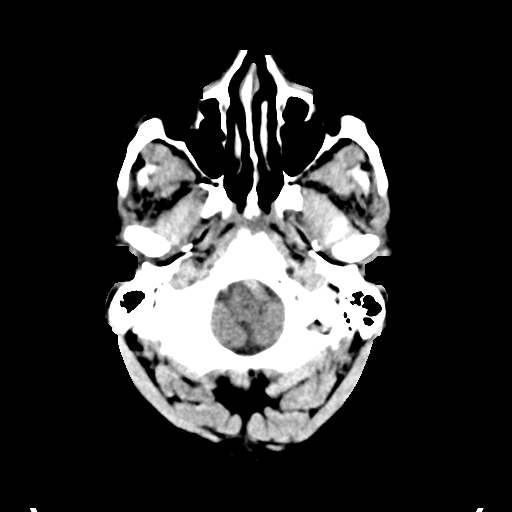
[im 4/32  bone]
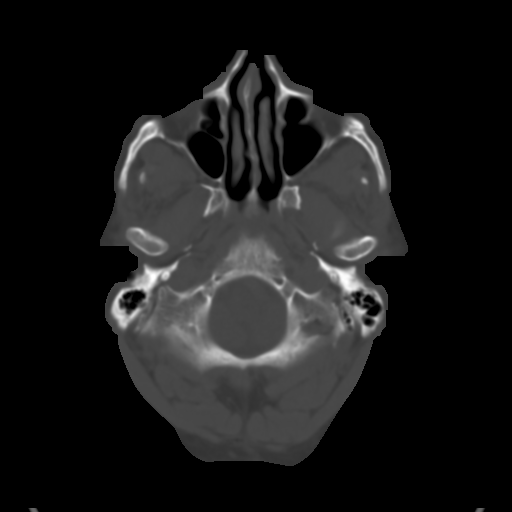
[im 8/32  brain]
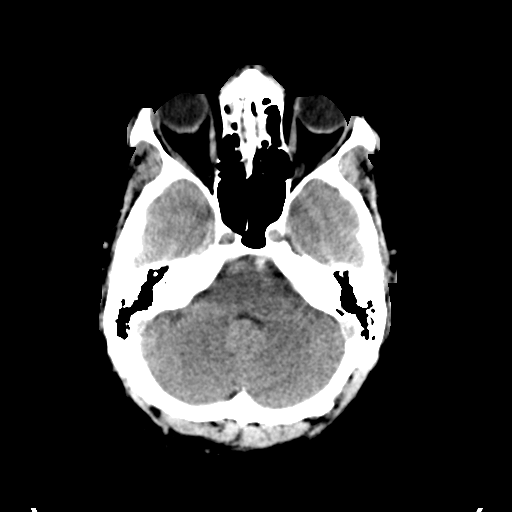
[im 12/32  brain]
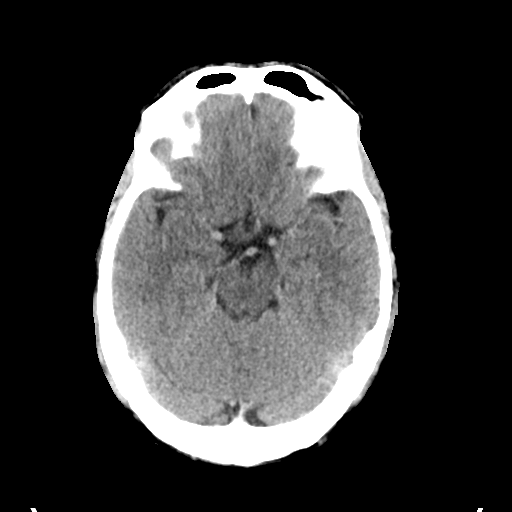
[im 16/32  brain]
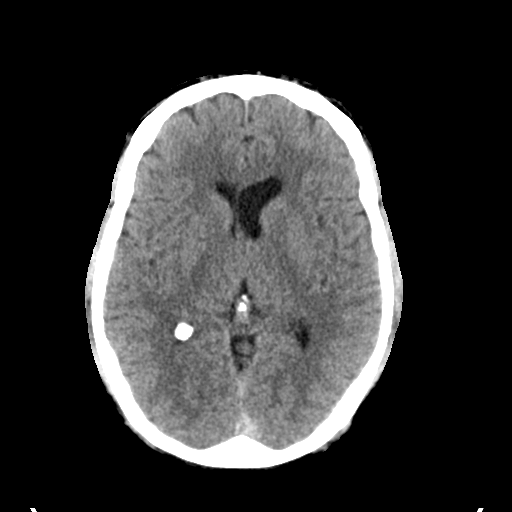
[im 20/32  brain]
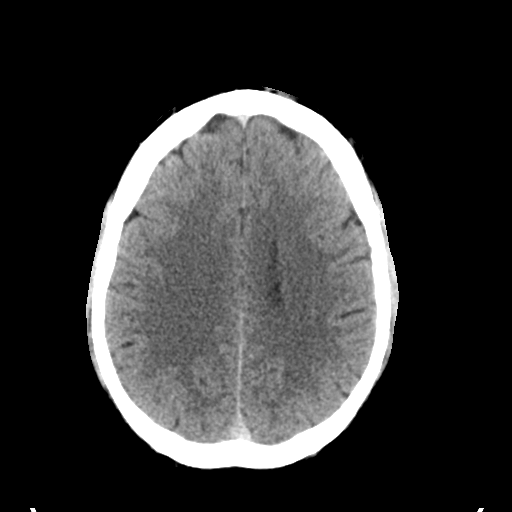
[im 20/32  bone]
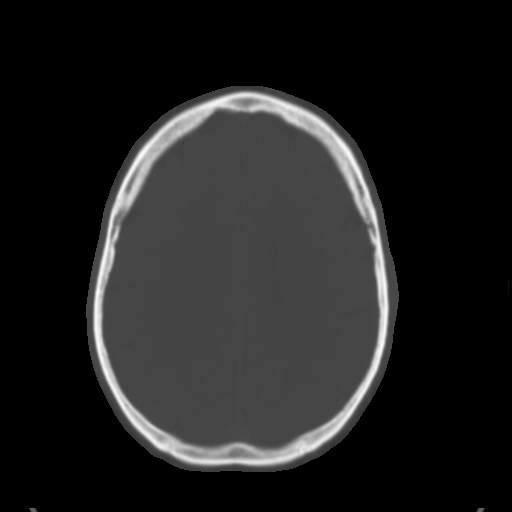
[im 24/32  brain]
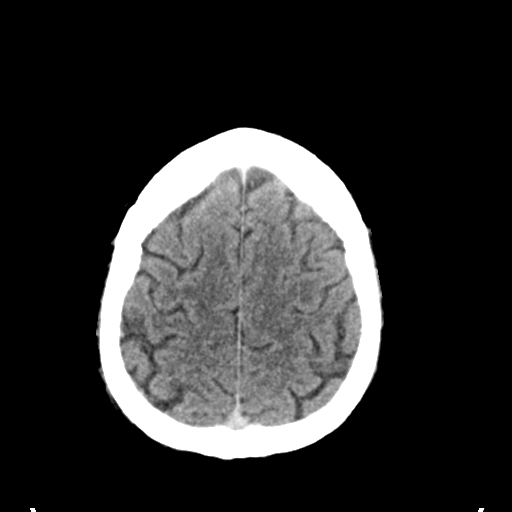
[im 28/32  brain]
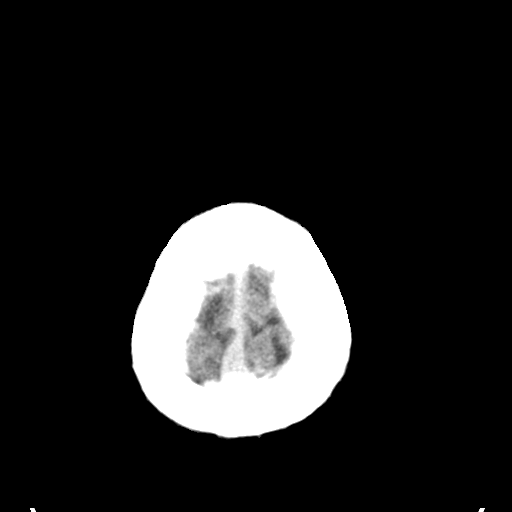

[Series 4: head bone · axial · 0.46mm/px · z∈[-106,-74]mm · 3 of 80 slices shown]
[im 8/80  bone]
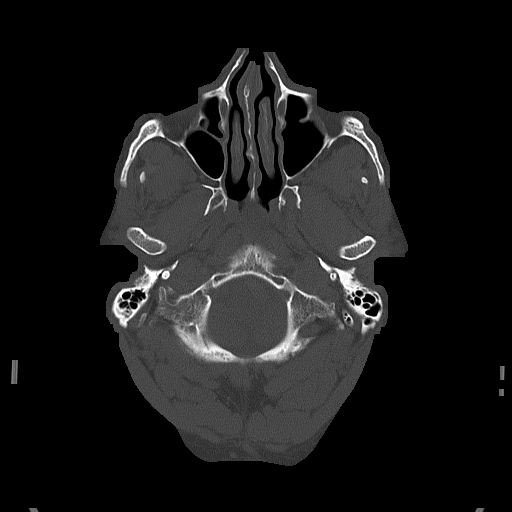
[im 16/80  bone]
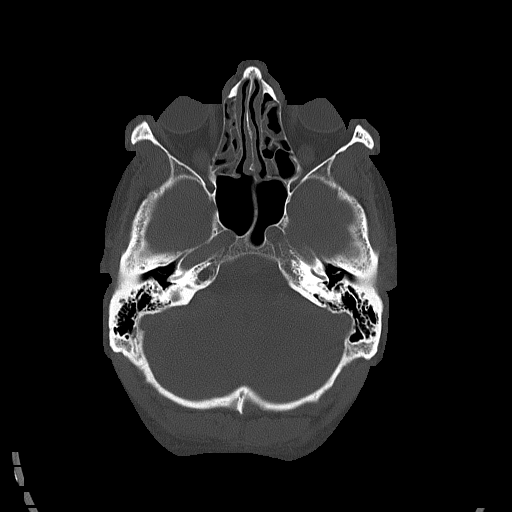
[im 24/80  bone]
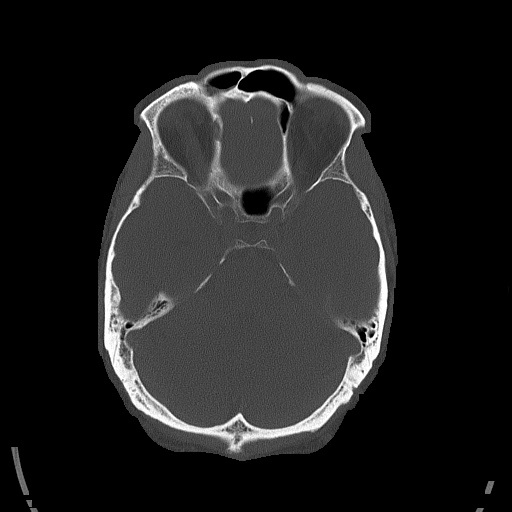

[Series 5: head without cor · coronal · non-contrast · 0.35mm/px · 3 of 69 slices shown]
[im 27/69  brain]
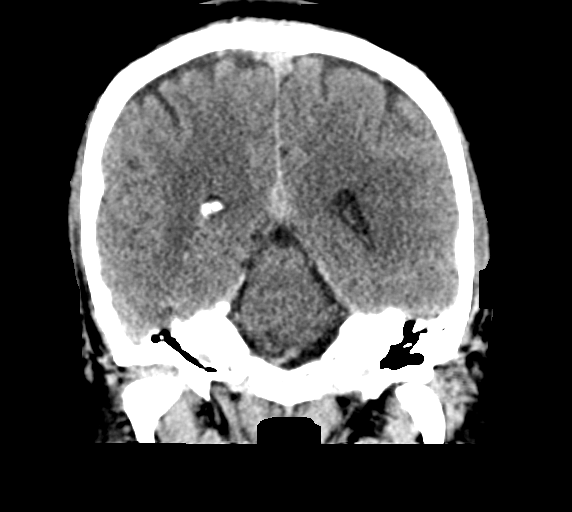
[im 32/69  brain]
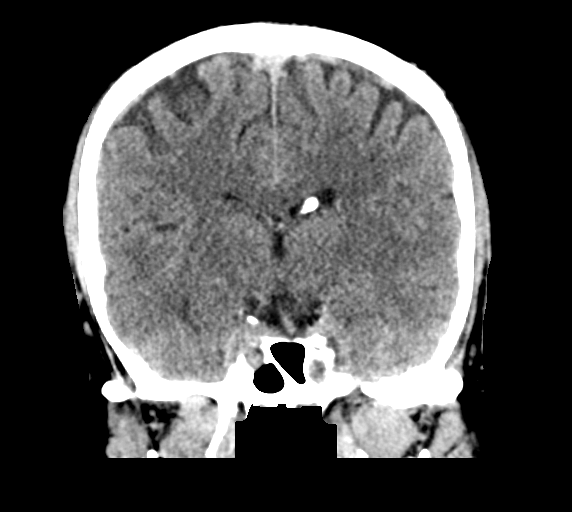
[im 37/69  brain]
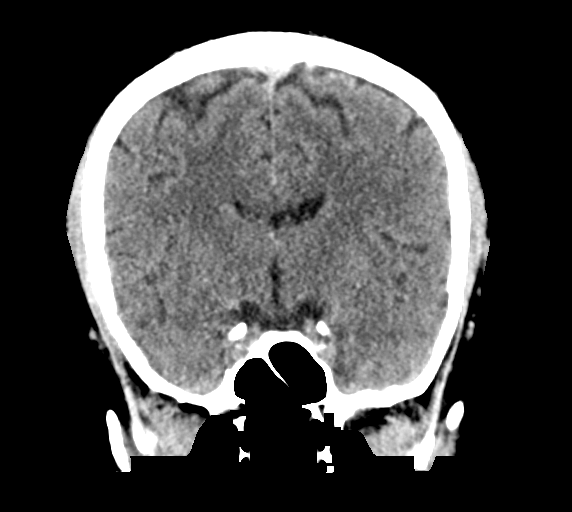

[Series 6: head without sag · sagittal · non-contrast · 0.34mm/px · 3 of 67 slices shown]
[im 23/67  brain]
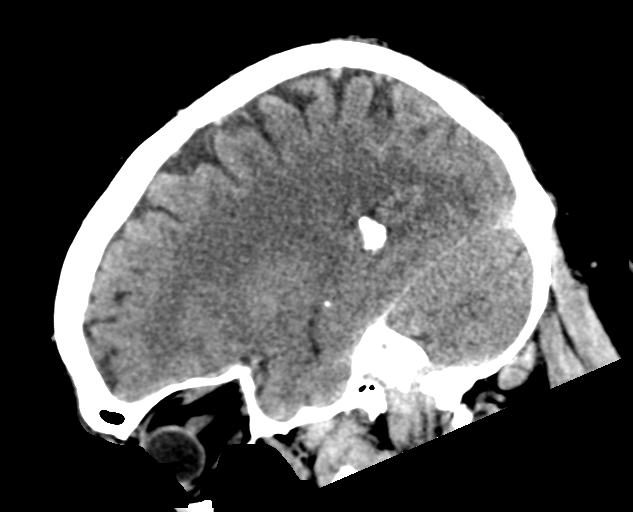
[im 34/67  brain]
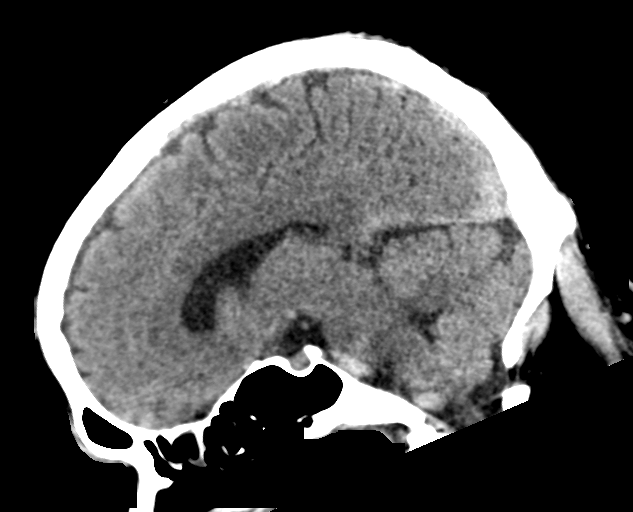
[im 45/67  brain]
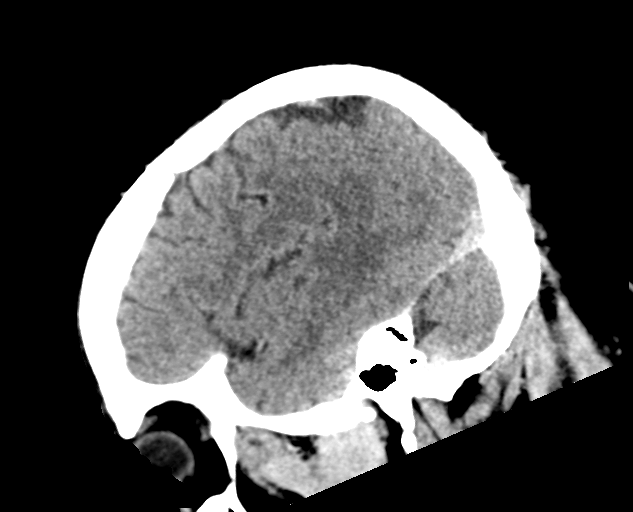

[16 of 47 positions shown; findings below may reference images not displayed]

FINDINGS: Brain: There is mild, stable cerebral atrophy with mild, stable
widening of the extra-axial spaces and ventricular dilatation.
There are areas of decreased attenuation within the white matter
tracts of the supratentorial brain, consistent with microvascular
disease changes.

Vascular: No hyperdense vessel or unexpected calcification.

Skull: Normal. Negative for fracture or focal lesion.

Sinuses/Orbits: Mild, bilateral ethmoid sinus mucosal thickening is
seen.

Other: None.
IMPRESSION: No acute intracranial abnormality.

## 2023-04-03 DIAGNOSIS — F411 Generalized anxiety disorder: Secondary | ICD-10-CM | POA: Diagnosis not present

## 2023-04-24 DIAGNOSIS — F411 Generalized anxiety disorder: Secondary | ICD-10-CM | POA: Diagnosis not present

## 2023-05-22 DIAGNOSIS — F411 Generalized anxiety disorder: Secondary | ICD-10-CM | POA: Diagnosis not present

## 2023-05-23 DIAGNOSIS — L821 Other seborrheic keratosis: Secondary | ICD-10-CM | POA: Diagnosis not present

## 2023-05-23 DIAGNOSIS — F419 Anxiety disorder, unspecified: Secondary | ICD-10-CM | POA: Diagnosis not present

## 2023-05-23 DIAGNOSIS — R7303 Prediabetes: Secondary | ICD-10-CM | POA: Diagnosis not present

## 2023-05-23 DIAGNOSIS — I10 Essential (primary) hypertension: Secondary | ICD-10-CM | POA: Diagnosis not present

## 2023-06-29 DIAGNOSIS — F411 Generalized anxiety disorder: Secondary | ICD-10-CM | POA: Diagnosis not present

## 2023-07-24 DIAGNOSIS — E782 Mixed hyperlipidemia: Secondary | ICD-10-CM | POA: Diagnosis not present

## 2023-07-24 DIAGNOSIS — R7303 Prediabetes: Secondary | ICD-10-CM | POA: Diagnosis not present

## 2023-07-24 DIAGNOSIS — F909 Attention-deficit hyperactivity disorder, unspecified type: Secondary | ICD-10-CM | POA: Diagnosis not present

## 2023-07-24 DIAGNOSIS — F419 Anxiety disorder, unspecified: Secondary | ICD-10-CM | POA: Diagnosis not present

## 2023-07-24 DIAGNOSIS — E291 Testicular hypofunction: Secondary | ICD-10-CM | POA: Diagnosis not present

## 2023-07-24 DIAGNOSIS — I10 Essential (primary) hypertension: Secondary | ICD-10-CM | POA: Diagnosis not present

## 2023-08-07 DIAGNOSIS — M79604 Pain in right leg: Secondary | ICD-10-CM | POA: Diagnosis not present

## 2023-08-14 DIAGNOSIS — M79604 Pain in right leg: Secondary | ICD-10-CM | POA: Diagnosis not present

## 2023-08-17 DIAGNOSIS — M79604 Pain in right leg: Secondary | ICD-10-CM | POA: Diagnosis not present

## 2023-08-21 DIAGNOSIS — M79604 Pain in right leg: Secondary | ICD-10-CM | POA: Diagnosis not present

## 2023-08-23 DIAGNOSIS — F411 Generalized anxiety disorder: Secondary | ICD-10-CM | POA: Diagnosis not present

## 2023-08-29 DIAGNOSIS — M79661 Pain in right lower leg: Secondary | ICD-10-CM | POA: Diagnosis not present

## 2023-09-18 DIAGNOSIS — M25561 Pain in right knee: Secondary | ICD-10-CM | POA: Diagnosis not present

## 2023-09-20 DIAGNOSIS — F411 Generalized anxiety disorder: Secondary | ICD-10-CM | POA: Diagnosis not present

## 2023-09-28 DIAGNOSIS — M25561 Pain in right knee: Secondary | ICD-10-CM | POA: Diagnosis not present

## 2023-10-03 DIAGNOSIS — M25561 Pain in right knee: Secondary | ICD-10-CM | POA: Diagnosis not present

## 2023-10-18 DIAGNOSIS — F411 Generalized anxiety disorder: Secondary | ICD-10-CM | POA: Diagnosis not present

## 2023-10-19 DIAGNOSIS — S83241D Other tear of medial meniscus, current injury, right knee, subsequent encounter: Secondary | ICD-10-CM | POA: Diagnosis not present

## 2023-11-20 DIAGNOSIS — M65961 Unspecified synovitis and tenosynovitis, right lower leg: Secondary | ICD-10-CM | POA: Diagnosis not present

## 2023-11-20 DIAGNOSIS — S83221A Peripheral tear of medial meniscus, current injury, right knee, initial encounter: Secondary | ICD-10-CM | POA: Diagnosis not present

## 2023-11-20 DIAGNOSIS — M94261 Chondromalacia, right knee: Secondary | ICD-10-CM | POA: Diagnosis not present

## 2023-11-20 DIAGNOSIS — X58XXXA Exposure to other specified factors, initial encounter: Secondary | ICD-10-CM | POA: Diagnosis not present

## 2023-11-20 DIAGNOSIS — Y999 Unspecified external cause status: Secondary | ICD-10-CM | POA: Diagnosis not present

## 2023-11-20 DIAGNOSIS — G8918 Other acute postprocedural pain: Secondary | ICD-10-CM | POA: Diagnosis not present

## 2023-11-20 DIAGNOSIS — M794 Hypertrophy of (infrapatellar) fat pad: Secondary | ICD-10-CM | POA: Diagnosis not present

## 2023-11-20 DIAGNOSIS — S83241A Other tear of medial meniscus, current injury, right knee, initial encounter: Secondary | ICD-10-CM | POA: Diagnosis not present

## 2023-11-27 DIAGNOSIS — M79604 Pain in right leg: Secondary | ICD-10-CM | POA: Diagnosis not present

## 2023-12-06 DIAGNOSIS — M79604 Pain in right leg: Secondary | ICD-10-CM | POA: Diagnosis not present

## 2023-12-08 DIAGNOSIS — M79604 Pain in right leg: Secondary | ICD-10-CM | POA: Diagnosis not present

## 2023-12-13 DIAGNOSIS — Z03818 Encounter for observation for suspected exposure to other biological agents ruled out: Secondary | ICD-10-CM | POA: Diagnosis not present

## 2023-12-13 DIAGNOSIS — R051 Acute cough: Secondary | ICD-10-CM | POA: Diagnosis not present

## 2023-12-13 DIAGNOSIS — M79604 Pain in right leg: Secondary | ICD-10-CM | POA: Diagnosis not present

## 2023-12-13 DIAGNOSIS — R509 Fever, unspecified: Secondary | ICD-10-CM | POA: Diagnosis not present

## 2023-12-15 DIAGNOSIS — J101 Influenza due to other identified influenza virus with other respiratory manifestations: Secondary | ICD-10-CM | POA: Diagnosis not present

## 2023-12-15 DIAGNOSIS — R062 Wheezing: Secondary | ICD-10-CM | POA: Diagnosis not present

## 2023-12-18 DIAGNOSIS — M79604 Pain in right leg: Secondary | ICD-10-CM | POA: Diagnosis not present

## 2023-12-20 DIAGNOSIS — M79604 Pain in right leg: Secondary | ICD-10-CM | POA: Diagnosis not present

## 2023-12-29 DIAGNOSIS — M79604 Pain in right leg: Secondary | ICD-10-CM | POA: Diagnosis not present

## 2024-01-03 DIAGNOSIS — M79604 Pain in right leg: Secondary | ICD-10-CM | POA: Diagnosis not present

## 2024-01-05 DIAGNOSIS — M79604 Pain in right leg: Secondary | ICD-10-CM | POA: Diagnosis not present

## 2024-01-15 DIAGNOSIS — M79604 Pain in right leg: Secondary | ICD-10-CM | POA: Diagnosis not present

## 2024-03-05 DIAGNOSIS — R7303 Prediabetes: Secondary | ICD-10-CM | POA: Diagnosis not present

## 2024-03-05 DIAGNOSIS — F419 Anxiety disorder, unspecified: Secondary | ICD-10-CM | POA: Diagnosis not present

## 2024-03-05 DIAGNOSIS — I10 Essential (primary) hypertension: Secondary | ICD-10-CM | POA: Diagnosis not present

## 2024-03-05 DIAGNOSIS — F909 Attention-deficit hyperactivity disorder, unspecified type: Secondary | ICD-10-CM | POA: Diagnosis not present

## 2024-03-28 DIAGNOSIS — M1711 Unilateral primary osteoarthritis, right knee: Secondary | ICD-10-CM | POA: Diagnosis not present

## 2024-05-07 DIAGNOSIS — S6010XA Contusion of unspecified finger with damage to nail, initial encounter: Secondary | ICD-10-CM | POA: Diagnosis not present

## 2024-08-29 DIAGNOSIS — R739 Hyperglycemia, unspecified: Secondary | ICD-10-CM | POA: Diagnosis not present

## 2024-08-29 DIAGNOSIS — E291 Testicular hypofunction: Secondary | ICD-10-CM | POA: Diagnosis not present

## 2024-08-29 DIAGNOSIS — I10 Essential (primary) hypertension: Secondary | ICD-10-CM | POA: Diagnosis not present

## 2024-10-01 DIAGNOSIS — E291 Testicular hypofunction: Secondary | ICD-10-CM | POA: Diagnosis not present

## 2024-11-11 ENCOUNTER — Ambulatory Visit (INDEPENDENT_AMBULATORY_CARE_PROVIDER_SITE_OTHER)

## 2024-11-11 ENCOUNTER — Ambulatory Visit (INDEPENDENT_AMBULATORY_CARE_PROVIDER_SITE_OTHER): Admitting: Physician Assistant

## 2024-11-11 ENCOUNTER — Encounter (INDEPENDENT_AMBULATORY_CARE_PROVIDER_SITE_OTHER): Payer: Self-pay | Admitting: Physician Assistant

## 2024-11-11 VITALS — BP 131/84 | HR 76 | Ht 67.0 in | Wt 210.0 lb

## 2024-11-11 DIAGNOSIS — H6993 Unspecified Eustachian tube disorder, bilateral: Secondary | ICD-10-CM

## 2024-11-11 DIAGNOSIS — H9042 Sensorineural hearing loss, unilateral, left ear, with unrestricted hearing on the contralateral side: Secondary | ICD-10-CM

## 2024-11-11 NOTE — Progress Notes (Signed)
" °  [address], [address] [place], [place] [address] [phone]  Audiological Evaluation    Name: Marvin Cooper     DOB:   Oct 31, 1971      MRN:   [id number]                                                                                     Service Date: 11/11/2024     Accompanied by: self    Patient comes today after Reyes Cohen, PA-C sent a referral for a hearing evaluation due to concerns with Eustachian tube dysfunction.   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  Ears pop  Family history of hearing loss  []    Amplification  []    Other  []      Otoscopy: Right ear: Clear external ear canal and notable landmarks visualized on the tympanic membrane. Left ear:  Clear external ear canal and notable landmarks visualized on the tympanic membrane.  Tympanometry: Right ear: Type A - Normal external ear canal volume with normal middle ear pressure and normal tympanic membrane compliance. Findings are consistent with normal middle ear function. Left ear: Type A - Normal external ear canal volume with normal middle ear pressure and normal tympanic membrane compliance. Findings are consistent with normal middle ear function.  Hearing Evaluation The hearing test results were completed under headphones and results are deemed to be of good reliability. Test technique:  conventional    Pure tone Audiometry: Right ear- Normal hearing from [phone] Hz.   Left ear-  Normal hearing [phone] and than a mild presumably  sensorineural hearing loss from 6000 Hz - 8000 Hz.  Speech Audiometry: Right ear- Speech Reception Threshold (SRT) was obtained at 10  dBHL. Left ear-Speech Reception Threshold (SRT) was obtained at 10 dBHL.   Word Recognition Score Tested using NU-6 (recorded) Right ear: 100% was obtained at a presentation level of 55 dBHL with contralateral masking which is deemed as  excellent. Left ear: 100% was obtained at a presentation level of 55 dBHL with contralateral masking which is deemed as  excellent.   Impression: There is not a significant difference in pure-tone thresholds between ears., There is not a significant difference in the word recognition score in between ears.    Recommendations: Follow up with ENT as scheduled. Return for a hearing evaluation if concerns with hearing changes arise or per MD recommendation.   Arlean Rake, AUD     "

## 2024-11-11 NOTE — Progress Notes (Unsigned)
 Dear Dr. Crecencio, Here is my assessment for our mutual patient, Marvin Cooper. Thank you for allowing me the opportunity to care for your patient. Please do not hesitate to contact me should you have any other questions. Sincerely, Chyrl Cohen PA-C  Otolaryngology Clinic Note Referring provider: Dr. Crecencio HPI:  Marvin Cooper is a 53 y.o. male kindly referred by Dr. Crecencio   Discussed the use of AI scribe software for clinical note transcription with the patient, who gave verbal consent to proceed.  History of Present Illness    Marvin Cooper is a 53 year old male with recurrent Eustachian tube dysfunction and prior tympanostomy tube placement who presents with persistent bilateral aural fullness and fluctuating hearing loss.  For several years, he has experienced intermittent bilateral aural fullness and fluctuating hearing loss, with worsening symptoms over the past six months. He describes transient episodes of improved hearing, often lasting less than fifteen minutes, during which environmental sounds, such as the television, seem excessively loud before returning to a muffled state.  He is able to perform Valsalva maneuvers, which provide only temporary relief. He reports frequent aural clicking and popping, particularly with mastication, which he finds bothersome. He denies otalgia, abnormal otologic sensations, or recent otitis media. He had earaches in childhood but none in recent years.  He has undergone tympanostomy tube placement twice, once in childhood and again approximately five to six years ago. One tube extruded spontaneously. His symptoms improved while the tubes were in place but recurred following tube extrusion.  Despite symptoms, he remains able to work and attended work today.      Independent Review of Additional Tests or Records:  09/22/2015 - ENT office visit with Dr. Roark   Otoscopy: Right ear: Clear external ear canal and notable landmarks visualized on the  tympanic membrane. Left ear:  Clear external ear canal and notable landmarks visualized on the tympanic membrane.   Tympanometry: Right ear: Type A - Normal external ear canal volume with normal middle ear pressure and normal tympanic membrane compliance. Findings are consistent with normal middle ear function. Left ear: Type A - Normal external ear canal volume with normal middle ear pressure and normal tympanic membrane compliance. Findings are consistent with normal middle ear function.   Hearing Evaluation The hearing test results were completed under headphones and results are deemed to be of good reliability. Test technique:  conventional     Pure tone Audiometry: Right ear- Normal hearing from [phone] Hz.   Left ear-  Normal hearing [phone] and than a mild presumably  sensorineural hearing loss from 6000 Hz - 8000 Hz.   Speech Audiometry: Right ear- Speech Reception Threshold (SRT) was obtained at 10 dBHL. Left ear-Speech Reception Threshold (SRT) was obtained at 10 dBHL.   Word Recognition Score Tested using NU-6 (recorded) Right ear: 100% was obtained at a presentation level of 55 dBHL with contralateral masking which is deemed as  excellent. Left ear: 100% was obtained at a presentation level of 55 dBHL with contralateral masking which is deemed as  excellent.   Impression: There is not a significant difference in pure-tone thresholds between ears., There is not a significant difference in the word recognition score in between ears.    Recommendations: Follow up with ENT as scheduled. Return for a hearing evaluation if concerns with hearing changes arise or per MD recommendation.     Arlean Rake, AUD    PMH/Meds/All/SocHx/FamHx/ROS:   Past Medical History:  Diagnosis Date   ADD (  attention deficit disorder)    Alcohol  abuse    Anxiety    Asthma    Chronic back pain    Depression    Hypercholesteremia    Hypertension    Low testosterone     Seasonal  allergies      Past Surgical History:  Procedure Laterality Date   ADENOIDECTOMY  Age 26   KNEE SURGERY     LEG SURGERY Left Age 23   ORIF lower leg   SHOULDER ARTHROSCOPY      Family History  Problem Relation Age of Onset   Hypertension Mother    Other Father        died in accident   Hypertension Sister    Alcohol  abuse Sister    Alcohol  abuse Brother    Heart attack Brother    Alcohol  abuse Maternal Grandfather      Social Connections: Not on file     Current Medications[1]   Physical Exam:   BP 131/84   Pulse 76   Ht 5' 7 (1.702 m)   Wt 210 lb (95.3 kg)   SpO2 95%   BMI 32.89 kg/m   Pertinent Findings  CN II-XII grossly intact Bilateral EAC clear and TM intact with well pneumatized middle ear spaces Weber 512: localizes right Rinne 512: AC > BC b/l  Anterior rhinoscopy: Septum midline; bilateral inferior turbinates with no hypertrophy No lesions of oral cavity/oropharynx; dentition WNl No obviously palpable neck masses/lymphadenopathy/thyromegaly No respiratory distress or stridor       Seprately Identifiable Procedures:  None  Impression & Plans:  Marvin Cooper is a 53 y.o. male with the following   Assessment and Plan    Eustachian tube dysfunction Chronic, recurrent dysfunction with prior improvement from tympanostomy tubes and symptom recurrence post-extrusion. Intermittent aural fullness and muffled hearing, worsened over six months. Further evaluation needed. - Ordered repeat audiometry to assess auditory function. - Referral to Dr. Roark for further evaluation based on audiometry results.        - f/u phone call discussion after consultation with Dr. Roark   Thank you for allowing me the opportunity to care for your patient. Please do not hesitate to contact me should you have any other questions.  Sincerely, Chyrl Cohen PA-C [facility] Phone: [phone] Fax: [phone]  11/11/2024, 2:29 PM         [1]   Current Outpatient Medications:    albuterol  (PROVENTIL  HFA;VENTOLIN  HFA) 108 (90 BASE) MCG/ACT inhaler, Inhale 2 puffs into the lungs every 4 (four) hours as needed for wheezing or shortness of breath., Disp: , Rfl:    amLODipine (NORVASC) 2.5 MG tablet, Take 2.5 mg by mouth daily., Disp: , Rfl:    amphetamine-dextroamphetamine (ADDERALL XR) 20 MG 24 hr capsule, Take 20 mg by mouth 2 (two) times daily., Disp: , Rfl:    azelastine (ASTELIN) 0.1 % nasal spray, Place 1 spray into both nostrils 2 (two) times daily., Disp: , Rfl:    benazepril  (LOTENSIN ) 40 MG tablet, Take 40 mg by mouth daily., Disp: , Rfl:    desvenlafaxine (PRISTIQ) 100 MG 24 hr tablet, Take 100 mg by mouth every morning., Disp: , Rfl:    fluticasone (FLONASE) 50 MCG/ACT nasal spray, Place 1 spray into both nostrils daily as needed for allergies., Disp: , Rfl:    meloxicam (MOBIC) 15 MG tablet, Take 15 mg by mouth daily., Disp: , Rfl:    metFORMIN (GLUCOPHAGE-XR) 500 MG 24 hr tablet, Take 500 mg by  mouth daily., Disp: , Rfl:    simvastatin  (ZOCOR ) 40 MG tablet, Take 40 mg by mouth at bedtime., Disp: , Rfl:    tiZANidine (ZANAFLEX) 4 MG tablet, Take 4 mg by mouth 3 (three) times daily as needed., Disp: , Rfl:    traZODone  (DESYREL ) 100 MG tablet, Take 100 mg by mouth at bedtime., Disp: , Rfl:    atomoxetine (STRATTERA) 80 MG capsule, Take 80 mg by mouth every morning. (Patient not taking: Reported on 11/11/2024), Disp: , Rfl:    Azelastine-Fluticasone 137-50 MCG/ACT SUSP, Place 2 sprays into the nose 2 (two) times daily. (Patient not taking: Reported on 11/11/2024), Disp: , Rfl:    methocarbamol (ROBAXIN) 750 MG tablet, Take 750 mg by mouth as needed. (Patient not taking: Reported on 11/11/2024), Disp: , Rfl:    mirtazapine (REMERON) 15 MG tablet, Take 15 mg by mouth at bedtime. (Patient not taking: Reported on 11/11/2024), Disp: , Rfl:    QUEtiapine (SEROQUEL) 200 MG tablet, Take 200 mg by mouth at bedtime. (Patient not taking:  Reported on 11/11/2024), Disp: , Rfl:    testosterone  cypionate (DEPOTESTOSTERONE CYPIONATE) 200 MG/ML injection, Inject into the muscle once a week. 0.6 ml (Patient not taking: Reported on 11/11/2024), Disp: , Rfl:

## 2024-11-13 ENCOUNTER — Ambulatory Visit (INDEPENDENT_AMBULATORY_CARE_PROVIDER_SITE_OTHER): Admitting: Otolaryngology

## 2024-11-13 ENCOUNTER — Encounter (INDEPENDENT_AMBULATORY_CARE_PROVIDER_SITE_OTHER): Payer: Self-pay | Admitting: Otolaryngology

## 2024-11-13 VITALS — BP 159/81 | HR 95 | Temp 98.3°F | Ht 67.0 in | Wt 210.0 lb

## 2024-11-13 DIAGNOSIS — H6993 Unspecified Eustachian tube disorder, bilateral: Secondary | ICD-10-CM | POA: Diagnosis not present

## 2024-11-13 NOTE — Progress Notes (Signed)
 Reason for Consult: Eustachian tube dysfunction Referring Physician: Chyrl Cohen  Marvin Cooper is an 53 y.o. male.  HPI: He is here for evaluation after seeing Chyrl for evaluation of previous eustachian tube dysfunction.  He has pressure in his ears and it is very bothersome to him.  He previously had tympanostomy tubes that were placed that resolved his sensation.  He now wants to proceed with another set of tubes to see if this will help him again.  He does not have otitis media problems.  He had an audiogram.  Past Medical History:  Diagnosis Date   ADD (attention deficit disorder)    Alcohol  abuse    Anxiety    Asthma    Chronic back pain    Depression    Hypercholesteremia    Hypertension    Low testosterone     Seasonal allergies     Past Surgical History:  Procedure Laterality Date   ADENOIDECTOMY  Age 26   KNEE SURGERY     LEG SURGERY Left Age 52   ORIF lower leg   SHOULDER ARTHROSCOPY      Family History  Problem Relation Age of Onset   Hypertension Mother    Other Father        died in accident   Hypertension Sister    Alcohol  abuse Sister    Alcohol  abuse Brother    Heart attack Brother    Alcohol  abuse Maternal Grandfather     Social History:  reports that he has been smoking cigarettes. He has a 10 pack-year smoking history. He has never used smokeless tobacco. He reports current drug use. Drug: Marijuana. No history on file for alcohol  use.  Allergies: Allergies[1]   No results found for this or any previous visit (from the past 48 hours).  No results found.  ROS Blood pressure (!) 159/81, pulse 95, temperature 98.3 F (36.8 C), temperature source Oral, height 5' 7 (1.702 m), weight 210 lb (95.3 kg). Physical Exam Constitutional:      Appearance: Normal appearance.  HENT:     Head: Normocephalic and atraumatic.     Right Ear: Tympanic membrane is without lesions and middle ear aerated, ear canal and external ear normal.     Left Ear: Tympanic  membrane is without lesions and middle ear aerated, ear canal and external ear normal.     Nose: Nose without deviation of septum.  Turbinates with mild hypertrophy, No significant swelling or masses.     Oral cavity/oropharynx: Mucous membranes are moist. No lesions or masses    Larynx: normal voice. Mirror attempted without success    Eyes:     Extraocular Movements: Extraocular movements intact.     Conjunctiva/sclera: Conjunctivae normal.     Pupils: Pupils are equal, round, and reactive to light.  Cardiovascular:     Rate and Rhythm: Normal rate.  Pulmonary:     Effort: Pulmonary effort is normal.  Musculoskeletal:     Cervical back: Normal range of motion and neck supple. No rigidity.  Lymphadenopathy:     Cervical: No cervical adenopathy or masses.salivary glands without lesions. .     Salivary glands- no mass or swelling Neurological:     Mental Status: He is alert. CN 2-12 intact. No nystagmus  Tympanostomy tube insertion  We discussed the risk, benefits, and options and all his questions were answered.  Consent was obtained.  The right ear canal was injected with 1% lidocaine  with 1 100,000 epinephrine .  The myringotomy was  then made in the anterior-inferior quadrant and out Paparella tube was placed without difficulty.  There was no middle ear effusion.  He tolerated this very well.    Assessment/Plan: Eustachian tube dysfunction-he has had tubes in the past so he understands that his audiogram does not have a type B tympanogram and does not have a indication negative pressure but because he had so much benefit out of tubes before he wants to proceed.  I agreed to place a tube in 1 year and see how that does.  He tolerated the tympanostomy tube placed without difficulty.  He will follow-up in 6 months sooner if he decides that that tube is helping his right ear and he wants 1 in the left ear.  Norleen Notice 11/13/2024, 8:54 AM        [1] No Known Allergies

## 2024-12-17 ENCOUNTER — Ambulatory Visit (INDEPENDENT_AMBULATORY_CARE_PROVIDER_SITE_OTHER): Admitting: Otolaryngology

## 2024-12-19 ENCOUNTER — Ambulatory Visit (INDEPENDENT_AMBULATORY_CARE_PROVIDER_SITE_OTHER): Admitting: Otolaryngology

## 2024-12-19 ENCOUNTER — Encounter (INDEPENDENT_AMBULATORY_CARE_PROVIDER_SITE_OTHER): Payer: Self-pay | Admitting: Otolaryngology

## 2024-12-19 VITALS — BP 132/78 | HR 100 | Ht 67.0 in | Wt 200.0 lb

## 2024-12-19 DIAGNOSIS — H6993 Unspecified Eustachian tube disorder, bilateral: Secondary | ICD-10-CM

## 2024-12-19 DIAGNOSIS — J329 Chronic sinusitis, unspecified: Secondary | ICD-10-CM

## 2024-12-19 MED ORDER — AMOXICILLIN-POT CLAVULANATE 875-125 MG PO TABS: 1.0000 | ORAL_TABLET | Freq: Two times a day (BID) | ORAL | 0 refills | Status: AC

## 2024-12-19 NOTE — Progress Notes (Signed)
 Reason for Consult: Follow-up Referring Physician: None  Marvin Cooper is an 54 y.o. male.  HPI: He is here for follow-up for his tympanostomy tube and eustachian tube dysfunction issues.  He still has a little bit of popping in the left ear but the right one with the tube is resolved.  He is having some sinus symptoms that he now thinks is the overall issue.  He has not had any antibiotics in a long time.  He has been on Astelin and Flonase for a long time.  He does have some colored drainage from his nose occasionally.  Occasionally a frontal headache.  Past Medical History:  Diagnosis Date   ADD (attention deficit disorder)    Alcohol  abuse    Anxiety    Asthma    Chronic back pain    Depression    Hypercholesteremia    Hypertension    Low testosterone     Seasonal allergies     Past Surgical History:  Procedure Laterality Date   ADENOIDECTOMY  Age 14   KNEE SURGERY     LEG SURGERY Left Age 58   ORIF lower leg   SHOULDER ARTHROSCOPY      Family History  Problem Relation Age of Onset   Hypertension Mother    Other Father        died in accident   Hypertension Sister    Alcohol  abuse Sister    Alcohol  abuse Brother    Heart attack Brother    Alcohol  abuse Maternal Grandfather     Social History:  reports that he has been smoking cigarettes. He has a 10 pack-year smoking history. He has never used smokeless tobacco. He reports current drug use. Drug: Marijuana. No history on file for alcohol  use.  Allergies: Allergies[1]   No results found for this or any previous visit (from the past 48 hours).  No results found.  ROS Blood pressure 132/78, pulse 100, height 5' 7 (1.702 m), weight 200 lb (90.7 kg), SpO2 95%. Physical Exam Constitutional:      Appearance: Normal appearance.  HENT:     Head: Normocephalic and atraumatic.     Right Ear: Tympanic membrane is without lesions and middle ear aerated, ear canal and external ear normal.     Left Ear: Tympanic membrane  is without lesions and middle ear aerated, ear canal and external ear normal.     Nose: Nose without deviation of septum.  Turbinates with mild hypertrophy, No significant swelling or masses.     Oral cavity/oropharynx: Mucous membranes are moist. No lesions or masses    Larynx: normal voice. Mirror attempted without success    Eyes:     Extraocular Movements: Extraocular movements intact.     Conjunctiva/sclera: Conjunctivae normal.     Pupils: Pupils are equal, round, and reactive to light.  Cardiovascular:     Rate and Rhythm: Normal rate.  Pulmonary:     Effort: Pulmonary effort is normal.  Musculoskeletal:     Cervical back: Normal range of motion and neck supple. No rigidity.  Lymphadenopathy:     Cervical: No cervical adenopathy or masses.salivary glands without lesions. .     Salivary glands- no mass or swelling Neurological:     Mental Status: He is alert. CN 2-12 intact. No nystagmus   Nasal endoscopy  We discussed the risk, benefits, and options.  All his questions were answered and consent was obtained.  The scope was inserted into the right nose and the turbinates  are a bit hypertrophied but no purulence.  No polyps.  The nasopharynx looks good with just a small amount of adenoid like tissue in the midline that is smooth and benign appearing.  The eustachian tube looks open and no evidence of any mass or lesion.  The left side is the same and also the nasopharynx eustachian tube area looks the same as the opposite right side without lesions or masses.  He tolerated the procedure well   Assessment/Plan: Sinusitis-at this point he needs to be treated for sinus infection as that is what it seems like and would make sense for him to have eustachian tube dysfunction from this issue.  I started him on Augmentin .  Will see how he does with that.  He will continue the Flonase that he has been on for over a year.  If he continues with issues we will proceed with a CT scan of his  sinuses.  Norleen Notice 12/19/2024, 10:41 AM        [1] No Known Allergies
